# Patient Record
Sex: Female | Born: 1985 | Race: Black or African American | Hispanic: No | Marital: Single | Smoking: Former smoker
Health system: Southern US, Community
[De-identification: ages and names within clinical notes are randomized; demographics above are authoritative.]

## PROBLEM LIST (undated history)

## (undated) ENCOUNTER — Inpatient Hospital Stay (HOSPITAL_COMMUNITY): Payer: Self-pay

## (undated) HISTORY — PX: WISDOM TOOTH EXTRACTION: SHX21

---
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
## 2012-11-01 ENCOUNTER — Encounter (HOSPITAL_COMMUNITY): Payer: Self-pay | Admitting: Emergency Medicine

## 2012-11-01 ENCOUNTER — Emergency Department (HOSPITAL_COMMUNITY)
Admission: EM | Admit: 2012-11-01 | Discharge: 2012-11-01 | Disposition: A | Discharge status: Home / Self Care | Payer: BC Managed Care – PPO | Source: Home / Self Care

## 2012-11-01 DIAGNOSIS — N644 Mastodynia: Secondary | ICD-10-CM

## 2012-11-01 MED ORDER — DICLOFENAC POTASSIUM 50 MG PO TABS: 50.0000 mg | ORAL_TABLET | Freq: Three times a day (TID) | ORAL | Status: DC

## 2012-11-01 NOTE — ED Provider Notes (Signed)
Medical screening examination/treatment/procedure(s) were performed by resident physician or non-physician practitioner and as supervising physician I was immediately available for consultation/collaboration.   Chanele Douglas DOUGLAS MD.   Addaline Peplinski D Karan Ramnauth, MD 11/01/12 1741 

## 2012-11-01 NOTE — ED Notes (Signed)
Patient requested a note for missing work and missing class today, provided 2 notes

## 2012-11-01 NOTE — ED Provider Notes (Signed)
   History    CSN: [id number] Arrival date & time 11/01/12  1332  First MD Initiated Contact with Patient 11/01/12 1427     Chief Complaint  Patient presents with  . Breast Pain   (Consider location/radiation/quality/duration/timing/severity/associated sxs/prior Treatment) HPI Comments: 27 year old female presents with pain in the right lateral breast for over one month. Approximately one month before that she was struck in the right lateral breast with a softball. She describes the pain as sharp, intermittent and shooting. She states that it is constant. Worse with touch and lying on the right side. Denies skin discoloration, visible lumps or skin changes. Denies correlation with menses or tearing cycle.  History reviewed. No pertinent past medical history. History reviewed. No pertinent past surgical history. No family history on file. History  Substance Use Topics  . Smoking status: Never Smoker   . Smokeless tobacco: Not on file  . Alcohol Use: No   OB History   Grav Para Term Preterm Abortions TAB SAB Ect Mult Living                 Review of Systems  Constitutional: Negative.   All other systems reviewed and are negative.    Allergies  Review of patient's allergies indicates no known allergies.  Home Medications   Current Outpatient Rx  Name  Route  Sig  Dispense  Refill  . diclofenac (CATAFLAM) 50 MG tablet   Oral   Take 1 tablet (50 mg total) by mouth 3 (three) times daily. Prn pain. Take with food.   21 tablet   0    BP 132/84  Pulse 84  Temp(Src) 98.4 F (36.9 C) (Oral)  Resp 16  SpO2 100%  LMP 10/25/2012 Physical Exam  Nursing note and vitals reviewed. Constitutional: She appears well-developed and well-nourished. No distress.  Neck: Normal range of motion. Neck supple.  Pulmonary/Chest: Effort normal. Right breast exhibits tenderness. Right breast exhibits no inverted nipple, no mass, no nipple discharge and no skin change. Left breast exhibits no  inverted nipple, no mass, no nipple discharge, no skin change and no tenderness. Breasts are symmetrical.    Markings of breast as above shows area of tenderness, No tenderness of nipple. No swelling. No discoloration or asymmetry. Both breasts have identical breast mass texture with dense tissue. No nodules or lumps seen or palpated.   Musculoskeletal: She exhibits no edema and no tenderness.  Neurological: She is alert. She exhibits normal muscle tone.  Skin: Skin is warm and dry.  Psychiatric: She has a normal mood and affect.    ED Course  Procedures (including critical care time) Labs Reviewed - No data to display No results found. 1. Breast pain in female     MDM  [facility] locally See your doctor in 1 week when you get back home. May need imaging. No abnormal findings of breast today. Kim,RN present during exam.  Hayden Rasmussen, NP 11/01/12 1458

## 2012-11-01 NOTE — ED Notes (Signed)
Reports right breast pain that started a month ago, no bump, no bruise, and no known injury at the time.  Patient does reports a month prior to breast soreness did get hit with softball in right breast, but unsure of related

## 2013-02-05 ENCOUNTER — Encounter (HOSPITAL_COMMUNITY): Payer: Self-pay | Admitting: Emergency Medicine

## 2013-02-05 ENCOUNTER — Emergency Department (INDEPENDENT_AMBULATORY_CARE_PROVIDER_SITE_OTHER)
Admission: EM | Admit: 2013-02-05 | Discharge: 2013-02-05 | Disposition: A | Discharge status: Home / Self Care | Payer: BC Managed Care – PPO | Source: Home / Self Care | Attending: Family Medicine | Admitting: Family Medicine

## 2013-02-05 DIAGNOSIS — J069 Acute upper respiratory infection, unspecified: Secondary | ICD-10-CM

## 2013-02-05 NOTE — ED Provider Notes (Addendum)
CSN: [id number]     Arrival date & time 02/05/13  1352 History   None    Chief Complaint  Patient presents with  . URI   (Consider location/radiation/quality/duration/timing/severity/associated sxs/prior Treatment) Patient is a 27 y.o. female presenting with URI. The history is provided by the patient.  URI Presenting symptoms: congestion, cough, fatigue, rhinorrhea and sore throat   Presenting symptoms: no fever   Severity:  Mild Onset quality:  Gradual Duration:  3 days Progression:  Unchanged Chronicity:  New   History reviewed. No pertinent past medical history. History reviewed. No pertinent past surgical history. No family history on file. History  Substance Use Topics  . Smoking status: Never Smoker   . Smokeless tobacco: Not on file  . Alcohol Use: No   OB History   Grav Para Term Preterm Abortions TAB SAB Ect Mult Living                 Review of Systems  Constitutional: Positive for fatigue. Negative for fever.  HENT: Positive for congestion, postnasal drip, rhinorrhea and sore throat.   Respiratory: Positive for cough.   Gastrointestinal: Negative.     Allergies  Review of patient's allergies indicates no known allergies.  Home Medications   Current Outpatient Rx  Name  Route  Sig  Dispense  Refill  . diclofenac (CATAFLAM) 50 MG tablet   Oral   Take 1 tablet (50 mg total) by mouth 3 (three) times daily. Prn pain. Take with food.   21 tablet   0    BP 130/92  Pulse 64  Temp(Src) 98.4 F (36.9 C) (Oral)  Resp 17  SpO2 98%  LMP 01/24/2013 Physical Exam  Nursing note and vitals reviewed. Constitutional: She is oriented to person, place, and time. She appears well-developed and well-nourished.  HENT:  Head: Normocephalic.  Right Ear: External ear normal.  Left Ear: External ear normal.  Nose: Nose normal.  Mouth/Throat: Oropharynx is clear and moist.  Eyes: Pupils are equal, round, and reactive to light.  Neck: Normal range of motion. Neck  supple.  Cardiovascular: Normal rate, normal heart sounds and intact distal pulses.   Pulmonary/Chest: Effort normal and breath sounds normal.  Abdominal: Soft. Bowel sounds are normal.  Lymphadenopathy:    She has no cervical adenopathy.  Neurological: She is alert and oriented to person, place, and time.  Skin: Skin is warm and dry.    ED Course  Procedures (including critical care time) Labs Review Labs Reviewed - No data to display Imaging Review No results found.    MDM      Linna Hoff, MD 02/05/13 1550  Linna Hoff, MD 02/08/13 1230

## 2013-02-05 NOTE — ED Notes (Signed)
Pt  Reports  Symptoms  Of  Congested      Cough   Symptoms     X  2    Days  Reports  General  Fatigue  As  Well    Pt   Ambulated to  Room with a  Slow  Steady   Gait  She  Is  Sitting upright on  Exam table  Speaking in  Complete  sentances  And is  In no acute  Distress

## 2013-04-11 NOTE — L&D Delivery Note (Signed)
I was present for the exam and agree with above.  [place], [place] 12/17/2013 8:37 PM

## 2013-04-11 NOTE — L&D Delivery Note (Signed)
Delivery Note At 4:28 PM a viable female was delivered via Vaginal, Spontaneous Delivery (Presentation: ; Occiput Anterior).  APGAR: 4, 8; weight pending .   Placenta status: Intact, Spontaneous.  Cord: 3 vessels with the following complications: none .  Cord pH: pending Of note, the neonate had approximately 2 minutes of HR in the 70-90s while pushing (the patient only pushed for approximately 3 minutes total). The baby delivered quickly, had poor color, was slow to cry despite rigorous stimulation, and initially had a HR in the 60s. She was bulb suctioned, the cord was clamped and cut, and code APGARs was called. Her HR, breathing and color improved. She was able to stay in the room with skin/skin with her mother.  Anesthesia: Epidural  Episiotomy: None Lacerations: 2nd degree;Perineal Suture Repair: 3.0 vicryl rapide Est. Blood Loss (mL):   Mom to postpartum.  Baby to Couplet care / Skin to Skin.  Joanna Puff 12/17/2013, 5:26 PM

## 2013-05-01 ENCOUNTER — Encounter: Payer: Self-pay | Admitting: *Deleted

## 2013-05-16 ENCOUNTER — Other Ambulatory Visit: Payer: Self-pay | Admitting: Advanced Practice Midwife

## 2013-05-16 ENCOUNTER — Ambulatory Visit (INDEPENDENT_AMBULATORY_CARE_PROVIDER_SITE_OTHER): Payer: BC Managed Care – PPO | Admitting: Advanced Practice Midwife

## 2013-05-16 ENCOUNTER — Encounter: Payer: Self-pay | Admitting: Advanced Practice Midwife

## 2013-05-16 VITALS — BP 124/77 | Temp 97.6°F | Ht 65.0 in | Wt 179.2 lb

## 2013-05-16 DIAGNOSIS — Z3491 Encounter for supervision of normal pregnancy, unspecified, first trimester: Secondary | ICD-10-CM

## 2013-05-16 DIAGNOSIS — Z3682 Encounter for antenatal screening for nuchal translucency: Secondary | ICD-10-CM

## 2013-05-16 DIAGNOSIS — Z3480 Encounter for supervision of other normal pregnancy, unspecified trimester: Secondary | ICD-10-CM

## 2013-05-16 DIAGNOSIS — N926 Irregular menstruation, unspecified: Secondary | ICD-10-CM

## 2013-05-16 DIAGNOSIS — Z789 Other specified health status: Secondary | ICD-10-CM

## 2013-05-16 LAB — POCT URINALYSIS DIP (DEVICE)
Bilirubin Urine: NEGATIVE
Glucose, UA: NEGATIVE mg/dL
HGB URINE DIPSTICK: NEGATIVE
Leukocytes, UA: NEGATIVE
Nitrite: NEGATIVE
PROTEIN: NEGATIVE mg/dL
Urobilinogen, UA: 1 mg/dL (ref 0.0–1.0)
pH: 6 (ref 5.0–8.0)

## 2013-05-16 NOTE — Patient Instructions (Signed)
Pregnancy - First Trimester  During sexual intercourse, millions of sperm go into the vagina. Only 1 sperm will penetrate and fertilize the female egg while it is in the Fallopian tube. One week later, the fertilized egg implants into the wall of the uterus. An embryo begins to develop into a baby. At 6 to 8 weeks, the eyes and face are formed and the heartbeat can be seen on ultrasound. At the end of 12 weeks (first trimester), all the baby's organs are formed. Now that you are pregnant, you will want to do everything you can to have a healthy baby. Two of the most important things are to get good prenatal care and follow your caregiver's instructions. Prenatal care is all the medical care you receive before the baby's birth. It is given to prevent, find, and treat problems during the pregnancy and childbirth.  PRENATAL EXAMS  · During prenatal visits, your weight, blood pressure, and urine are checked. This is done to make sure you are healthy and progressing normally during the pregnancy.  · A pregnant woman should gain 25 to 35 pounds during the pregnancy. However, if you are overweight or underweight, your caregiver will advise you regarding your weight.  · Your caregiver will ask and answer questions for you.  · Blood work, cervical cultures, other necessary tests, and a Pap test are done during your prenatal exams. These tests are done to check on your health and the probable health of your baby. Tests are strongly recommended and done for HIV with your permission. This is the virus that causes AIDS. These tests are done because medicines can be given to help prevent your baby from being born with this infection should you have been infected without knowing it. Blood work is also used to find out your blood type, previous infections, and follow your blood levels (hemoglobin).  · Low hemoglobin (anemia) is common during pregnancy. Iron and vitamins are given to help prevent this. Later in the pregnancy, blood  tests for diabetes will be done along with any other tests if any problems develop.  · You may need other tests to make sure you and the baby are doing well.  CHANGES DURING THE FIRST TRIMESTER   Your body goes through many changes during pregnancy. They vary from person to person. Talk to your caregiver about changes you notice and are concerned about. Changes can include:  · Your menstrual period stops.  · The egg and sperm carry the genes that determine what you look like. Genes from you and your partner are forming a baby. The female genes determine whether the baby is a boy or a girl.  · Your body increases in girth and you may feel bloated.  · Feeling sick to your stomach (nauseous) and throwing up (vomiting). If the vomiting is uncontrollable, call your caregiver.  · Your breasts will begin to enlarge and become tender.  · Your nipples may stick out more and become darker.  · The need to urinate more. Painful urination may mean you have a bladder infection.  · Tiring easily.  · Loss of appetite.  · Cravings for certain kinds of food.  · At first, you may gain or lose a couple of pounds.  · You may have changes in your emotions from day to day (excited to be pregnant or concerned something may go wrong with the pregnancy and baby).  · You may have more vivid and strange dreams.  HOME CARE INSTRUCTIONS   ·   It is very important to avoid all smoking, alcohol and non-prescribed drugs during your pregnancy. These affect the formation and growth of the baby. Avoid chemicals while pregnant to ensure the delivery of a healthy infant.  · Start your prenatal visits by the 12th week of pregnancy. They are usually scheduled monthly at first, then more often in the last 2 months before delivery. Keep your caregiver's appointments. Follow your caregiver's instructions regarding medicine use, blood and lab tests, exercise, and diet.  · During pregnancy, you are providing food for you and your baby. Eat regular, well-balanced  meals. Choose foods such as meat, fish, milk and other low fat dairy products, vegetables, fruits, and whole-grain breads and cereals. Your caregiver will tell you of the ideal weight gain.  · You can help morning sickness by keeping soda crackers at the bedside. Eat a couple before arising in the morning. You may want to use the crackers without salt on them.  · Eating 4 to 5 small meals rather than 3 large meals a day also may help the nausea and vomiting.  · Drinking liquids between meals instead of during meals also seems to help nausea and vomiting.  · A physical sexual relationship may be continued throughout pregnancy if there are no other problems. Problems may be early (premature) leaking of amniotic fluid from the membranes, vaginal bleeding, or belly (abdominal) pain.  · Exercise regularly if there are no restrictions. Check with your caregiver or physical therapist if you are unsure of the safety of some of your exercises. Greater weight gain will occur in the last 2 trimesters of pregnancy. Exercising will help:  · Control your weight.  · Keep you in shape.  · Prepare you for labor and delivery.  · Help you lose your pregnancy weight after you deliver your baby.  · Wear a good support or jogging bra for breast tenderness during pregnancy. This may help if worn during sleep too.  · Ask when prenatal classes are available. Begin classes when they are offered.  · Do not use hot tubs, steam rooms, or saunas.  · Wear your seat belt when driving. This protects you and your baby if you are in an accident.  · Avoid raw meat, uncooked cheese, cat litter boxes, and soil used by cats throughout the pregnancy. These carry germs that can cause birth defects in the baby.  · The first trimester is a good time to visit your dentist for your dental health. Getting your teeth cleaned is okay. Use a softer toothbrush and brush gently during pregnancy.  · Ask for help if you have financial, counseling, or nutritional needs  during pregnancy. Your caregiver will be able to offer counseling for these needs as well as refer you for other special needs.  · Do not take any medicines or herbs unless told by your caregiver.  · Inform your caregiver if there is any mental or physical domestic violence.  · Make a list of emergency phone numbers of family, friends, hospital, and police and fire departments.  · Write down your questions. Take them to your prenatal visit.  · Do not douche.  · Do not cross your legs.  · If you have to stand for long periods of time, rotate you feet or take small steps in a circle.  · You may have more vaginal secretions that may require a sanitary pad. Do not use tampons or scented sanitary pads.  MEDICINES AND DRUG USE IN PREGNANCY  ·   Take prenatal vitamins as directed. The vitamin should contain 1 milligram of folic acid. Keep all vitamins out of reach of children. Only a couple vitamins or tablets containing iron may be fatal to a baby or young child when ingested.  · Avoid use of all medicines, including herbs, over-the-counter medicines, not prescribed or suggested by your caregiver. Only take over-the-counter or prescription medicines for pain, discomfort, or fever as directed by your caregiver. Do not use aspirin, ibuprofen, or naproxen unless directed by your caregiver.  · Let your caregiver also know about herbs you may be using.  · Alcohol is related to a number of birth defects. This includes fetal alcohol syndrome. All alcohol, in any form, should be avoided completely. Smoking will cause low birth rate and premature babies.  · Street or illegal drugs are very harmful to the baby. They are absolutely forbidden. A baby born to an addicted mother will be addicted at birth. The baby will go through the same withdrawal an adult does.  · Let your caregiver know about any medicines that you have to take and for what reason you take them.  SEEK MEDICAL CARE IF:   You have any concerns or worries during your  pregnancy. It is better to call with your questions if you feel they cannot wait, rather than worry about them.  SEEK IMMEDIATE MEDICAL CARE IF:   · An unexplained oral temperature above 102° F (38.9° C) develops, or as your caregiver suggests.  · You have leaking of fluid from the vagina (birth canal). If leaking membranes are suspected, take your temperature and inform your caregiver of this when you call.  · There is vaginal spotting or bleeding. Notify your caregiver of the amount and how many pads are used.  · You develop a bad smelling vaginal discharge with a change in the color.  · You continue to feel sick to your stomach (nauseated) and have no relief from remedies suggested. You vomit blood or coffee ground-like materials.  · You lose more than 2 pounds of weight in 1 week.  · You gain more than 2 pounds of weight in 1 week and you notice swelling of your face, hands, feet, or legs.  · You gain 5 pounds or more in 1 week (even if you do not have swelling of your hands, face, legs, or feet).  · You get exposed to German measles and have never had them.  · You are exposed to fifth disease or chickenpox.  · You develop belly (abdominal) pain. Round ligament discomfort is a common non-cancerous (benign) cause of abdominal pain in pregnancy. Your caregiver still must evaluate this.  · You develop headache, fever, diarrhea, pain with urination, or shortness of breath.  · You fall or are in a car accident or have any kind of trauma.  · There is mental or physical violence in your home.  Document Released: 03/22/2001 Document Revised: 12/21/2011 Document Reviewed: 09/23/2008  [employer]® Patient Information ©2014 [employer].

## 2013-05-16 NOTE — Progress Notes (Signed)
p-87 

## 2013-05-16 NOTE — Progress Notes (Signed)
New OB. See smart set  Subjective:    [name] is a G1P0 617w3d being seen today for her first obstetrical visit.  Her obstetrical history is significant for Primigravida. Patient does intend to breast feed. Pregnancy history fully reviewed.  Patient reports no complaints.  Filed Vitals:   05/16/13 1444 05/16/13 1446  BP: 124/77   Temp: 97.6 F (36.4 C)   Height:  5\' 5"  (1.651 m)  Weight: 81.285 kg (179 lb 3.2 oz)     HISTORY: OB History  Gravida Para Term Preterm AB SAB TAB Ectopic Multiple Living  1             # Outcome Date GA Lbr Len/2nd Weight Sex Delivery Anes PTL Lv  1 CUR              History reviewed. No pertinent past medical history. Past Surgical History  Procedure Laterality Date  . Knee surgery Left 2001   Family History  Problem Relation Age of Onset  . Hypertension Mother   . Diabetes Maternal Grandfather      Exam    Uterus:  Fundal Height: 8 cm  Pelvic Exam:    Perineum: No Hemorrhoids, Normal Perineum   Vulva: Bartholin's, Urethra, Skene's normal   Vagina:  normal discharge   pH:    Cervix: no cervical motion tenderness and nulliparous appearance   Adnexa: normal adnexa and no mass, fullness, tenderness   Bony Pelvis: gynecoid  System: Breast:  normal appearance, no masses or tenderness   Skin: normal coloration and turgor, no rashes    Neurologic: oriented, grossly non-focal   Extremities: normal strength, tone, and muscle mass   HEENT neck supple with midline trachea   Mouth/Teeth mucous membranes moist, pharynx normal without lesions   Neck supple and no masses   Cardiovascular: regular rate and rhythm, no murmurs or gallops   Respiratory:  appears well, vitals normal, no respiratory distress, acyanotic, normal RR, ear and throat exam is normal, neck free of mass or lymphadenopathy, chest clear, no wheezing, crepitations, rhonchi, normal symmetric air entry   Abdomen: soft, non-tender; bowel sounds normal; no masses,  no  organomegaly   Urinary: urethral meatus normal      Assessment:    Pregnancy: G1P0 Patient Active Problem List   Diagnosis Date Noted  . Not immune to rubella 05/17/2013  . Supervision of normal pregnancy in first trimester 05/16/2013        Plan:     Initial labs drawn. Prenatal vitamins. Problem list reviewed and updated. Genetic Screening discussed Integrated Screen: ordered.  Ultrasound discussed; fetal survey: ordered.  Follow up in 4 weeks. 50% of 30 min visit spent on counseling and coordination of care.     [facility]MARIE 05/17/2013

## 2013-05-17 DIAGNOSIS — Z789 Other specified health status: Secondary | ICD-10-CM | POA: Insufficient documentation

## 2013-05-17 LAB — OBSTETRIC PANEL
ANTIBODY SCREEN: NEGATIVE
Basophils Absolute: 0 10*3/uL (ref 0.0–0.1)
Basophils Relative: 0 % (ref 0–1)
EOS ABS: 0 10*3/uL (ref 0.0–0.7)
EOS PCT: 0 % (ref 0–5)
HEMATOCRIT: 37 % (ref 36.0–46.0)
Hemoglobin: 12.5 g/dL (ref 12.0–15.0)
Hepatitis B Surface Ag: NEGATIVE
LYMPHS ABS: 2.5 10*3/uL (ref 0.7–4.0)
LYMPHS PCT: 20 % (ref 12–46)
MCH: 28.9 pg (ref 26.0–34.0)
MCHC: 33.8 g/dL (ref 30.0–36.0)
MCV: 85.6 fL (ref 78.0–100.0)
MONO ABS: 1.2 10*3/uL — AB (ref 0.1–1.0)
Monocytes Relative: 10 % (ref 3–12)
Neutro Abs: 9.1 10*3/uL — ABNORMAL HIGH (ref 1.7–7.7)
Neutrophils Relative %: 70 % (ref 43–77)
Platelets: 321 10*3/uL (ref 150–400)
RBC: 4.32 MIL/uL (ref 3.87–5.11)
RDW: 14.1 % (ref 11.5–15.5)
RUBELLA: 0.47 {index} (ref <0.90)
Rh Type: POSITIVE
WBC: 13 10*3/uL — AB (ref 4.0–10.5)

## 2013-05-17 LAB — HIV ANTIBODY (ROUTINE TESTING W REFLEX): HIV: NONREACTIVE

## 2013-05-17 LAB — CULTURE, OB URINE
COLONY COUNT: NO GROWTH
ORGANISM ID, BACTERIA: NO GROWTH

## 2013-05-20 LAB — PRESCRIPTION MONITORING PROFILE (19 PANEL)
AMPHETAMINE/METH: NEGATIVE ng/mL
BARBITURATE SCREEN, URINE: NEGATIVE ng/mL
BENZODIAZEPINE SCREEN, URINE: NEGATIVE ng/mL
Buprenorphine, Urine: NEGATIVE ng/mL
Carisoprodol, Urine: NEGATIVE ng/mL
Cocaine Metabolites: NEGATIVE ng/mL
Creatinine, Urine: 255.36 mg/dL (ref >20.0)
Fentanyl, Ur: NEGATIVE ng/mL
MDMA URINE: NEGATIVE ng/mL
MEPERIDINE UR: NEGATIVE ng/mL
Methadone Screen, Urine: NEGATIVE ng/mL
Methaqualone: NEGATIVE ng/mL
NITRITES URINE, INITIAL: NEGATIVE ug/mL
OPIATE SCREEN, URINE: NEGATIVE ng/mL
Oxycodone Screen, Ur: NEGATIVE ng/mL
PH URINE, INITIAL: 6.3 pH (ref 4.5–8.9)
PROPOXYPHENE: NEGATIVE ng/mL
Phencyclidine, Ur: NEGATIVE ng/mL
TAPENTADOLUR: NEGATIVE ng/mL
TRAMADOL UR: NEGATIVE ng/mL
ZOLPIDEM, URINE: NEGATIVE ng/mL

## 2013-05-20 LAB — HEMOGLOBINOPATHY EVALUATION
HGB A: 97.1 % (ref 96.8–97.8)
Hemoglobin Other: 0 %
Hgb A2 Quant: 2.9 % (ref 2.2–3.2)
Hgb F Quant: 0 % (ref 0.0–2.0)
Hgb S Quant: 0 %

## 2013-05-20 LAB — CANNABANOIDS (GC/LC/MS), URINE: THC-COOH (GC/LC/MS), ur confirm: 128 ng/mL — AB

## 2013-05-28 ENCOUNTER — Telehealth: Payer: Self-pay | Admitting: *Deleted

## 2013-05-28 DIAGNOSIS — A749 Chlamydial infection, unspecified: Secondary | ICD-10-CM

## 2013-05-28 DIAGNOSIS — O98819 Other maternal infectious and parasitic diseases complicating pregnancy, unspecified trimester: Principal | ICD-10-CM

## 2013-05-28 MED ORDER — AZITHROMYCIN 250 MG PO TABS: ORAL_TABLET | ORAL | Status: DC

## 2013-05-28 NOTE — Telephone Encounter (Signed)
Called [name] and notified her + chlamydia and need for her and her partner to be treated and rx for her sent to her pharmacy of choice. [name] voices understanding. Health department form completed.

## 2013-05-28 NOTE — Telephone Encounter (Signed)
Per chart review patient had + chlamydia on pap- need treatment sent to pharmacy and patient notified.

## 2013-06-04 ENCOUNTER — Other Ambulatory Visit: Payer: Self-pay | Admitting: Obstetrics and Gynecology

## 2013-06-04 DIAGNOSIS — Z3682 Encounter for antenatal screening for nuchal translucency: Secondary | ICD-10-CM

## 2013-06-07 ENCOUNTER — Ambulatory Visit (HOSPITAL_COMMUNITY)
Admission: RE | Admit: 2013-06-07 | Discharge: 2013-06-07 | Disposition: A | Discharge status: Home / Self Care | Payer: BC Managed Care – PPO | Source: Ambulatory Visit | Attending: Advanced Practice Midwife | Admitting: Advanced Practice Midwife

## 2013-06-07 ENCOUNTER — Ambulatory Visit (HOSPITAL_COMMUNITY)
Admission: RE | Admit: 2013-06-07 | Discharge: 2013-06-07 | Disposition: A | Discharge status: Home / Self Care | Payer: BC Managed Care – PPO | Source: Ambulatory Visit | Attending: Obstetrics and Gynecology | Admitting: Obstetrics and Gynecology

## 2013-06-07 VITALS — BP 135/81 | HR 87 | Wt 184.0 lb

## 2013-06-07 DIAGNOSIS — Z789 Other specified health status: Secondary | ICD-10-CM

## 2013-06-07 DIAGNOSIS — Z3689 Encounter for other specified antenatal screening: Secondary | ICD-10-CM | POA: Insufficient documentation

## 2013-06-07 DIAGNOSIS — O3510X0 Maternal care for (suspected) chromosomal abnormality in fetus, unspecified, not applicable or unspecified: Secondary | ICD-10-CM | POA: Insufficient documentation

## 2013-06-07 DIAGNOSIS — Z3682 Encounter for antenatal screening for nuchal translucency: Secondary | ICD-10-CM

## 2013-06-07 DIAGNOSIS — O351XX0 Maternal care for (suspected) chromosomal abnormality in fetus, not applicable or unspecified: Secondary | ICD-10-CM | POA: Insufficient documentation

## 2013-06-07 DIAGNOSIS — Z3491 Encounter for supervision of normal pregnancy, unspecified, first trimester: Secondary | ICD-10-CM

## 2013-06-12 ENCOUNTER — Ambulatory Visit (INDEPENDENT_AMBULATORY_CARE_PROVIDER_SITE_OTHER): Payer: Medicaid Other | Admitting: Obstetrics and Gynecology

## 2013-06-12 ENCOUNTER — Encounter: Payer: Self-pay | Admitting: Obstetrics and Gynecology

## 2013-06-12 VITALS — BP 116/75 | Temp 98.2°F | Wt 181.1 lb

## 2013-06-12 DIAGNOSIS — B3731 Acute candidiasis of vulva and vagina: Secondary | ICD-10-CM

## 2013-06-12 DIAGNOSIS — Z3480 Encounter for supervision of other normal pregnancy, unspecified trimester: Secondary | ICD-10-CM

## 2013-06-12 DIAGNOSIS — F1210 Cannabis abuse, uncomplicated: Secondary | ICD-10-CM

## 2013-06-12 DIAGNOSIS — A749 Chlamydial infection, unspecified: Secondary | ICD-10-CM | POA: Insufficient documentation

## 2013-06-12 DIAGNOSIS — Z3491 Encounter for supervision of normal pregnancy, unspecified, first trimester: Secondary | ICD-10-CM

## 2013-06-12 DIAGNOSIS — B373 Candidiasis of vulva and vagina: Secondary | ICD-10-CM

## 2013-06-12 DIAGNOSIS — F1290 Cannabis use, unspecified, uncomplicated: Secondary | ICD-10-CM

## 2013-06-12 LAB — POCT URINALYSIS DIP (DEVICE)
BILIRUBIN URINE: NEGATIVE
GLUCOSE, UA: NEGATIVE mg/dL
Hgb urine dipstick: NEGATIVE
Ketones, ur: NEGATIVE mg/dL
NITRITE: NEGATIVE
PH: 6 (ref 5.0–8.0)
Protein, ur: 30 mg/dL — AB
Specific Gravity, Urine: >=1.03 (ref 1.005–1.030)
Urobilinogen, UA: 1 mg/dL (ref 0.0–1.0)

## 2013-06-12 MED ORDER — FLUCONAZOLE 150 MG PO TABS: 150.0000 mg | ORAL_TABLET | Freq: Once | ORAL | Status: DC

## 2013-06-12 MED ORDER — FLUCONAZOLE 150 MG PO TABS: ORAL_TABLET | ORAL | Status: DC

## 2013-06-12 MED ORDER — PRENATAL VITAMINS 0.8 MG PO TABS: 1.0000 | ORAL_TABLET | Freq: Every day | ORAL | Status: DC

## 2013-06-12 NOTE — Patient Instructions (Signed)
Second Trimester of Pregnancy The second trimester is from week 13 through week 28, months 4 through 6. The second trimester is often a time when you feel your best. Your body has also adjusted to being pregnant, and you begin to feel better physically. Usually, morning sickness has lessened or quit completely, you may have more energy, and you may have an increase in appetite. The second trimester is also a time when the fetus is growing rapidly. At the end of the sixth month, the fetus is about 9 inches long and weighs about 1 pounds. You will likely begin to feel the baby move (quickening) between 18 and 20 weeks of the pregnancy. BODY CHANGES Your body goes through many changes during pregnancy. The changes vary from woman to woman.   Your weight will continue to increase. You will notice your lower abdomen bulging out.  You may begin to get stretch marks on your hips, abdomen, and breasts.  You may develop headaches that can be relieved by medicines approved by your caregiver.  You may urinate more often because the fetus is pressing on your bladder.  You may develop or continue to have heartburn as a result of your pregnancy.  You may develop constipation because certain hormones are causing the muscles that push waste through your intestines to slow down.  You may develop hemorrhoids or swollen, bulging veins (varicose veins).  You may have back pain because of the weight gain and pregnancy hormones relaxing your joints between the bones in your pelvis and as a result of a shift in weight and the muscles that support your balance.  Your breasts will continue to grow and be tender.  Your gums may bleed and may be sensitive to brushing and flossing.  Dark spots or blotches (chloasma, mask of pregnancy) may develop on your face. This will likely fade after the baby is born.  A dark line from your belly button to the pubic area (linea nigra) may appear. This will likely fade after the  baby is born. WHAT TO EXPECT AT YOUR PRENATAL VISITS During a routine prenatal visit:  You will be weighed to make sure you and the fetus are growing normally.  Your blood pressure will be taken.  Your abdomen will be measured to track your baby's growth.  The fetal heartbeat will be listened to.  Any test results from the previous visit will be discussed. Your caregiver may ask you:  How you are feeling.  If you are feeling the baby move.  If you have had any abnormal symptoms, such as leaking fluid, bleeding, severe headaches, or abdominal cramping.  If you have any questions. Other tests that may be performed during your second trimester include:  Blood tests that check for:  Low iron levels (anemia).  Gestational diabetes (between 24 and 28 weeks).  Rh antibodies.  Urine tests to check for infections, diabetes, or protein in the urine.  An ultrasound to confirm the proper growth and development of the baby.  An amniocentesis to check for possible genetic problems.  Fetal screens for spina bifida and Down syndrome. HOME CARE INSTRUCTIONS   Avoid all smoking, herbs, alcohol, and unprescribed drugs. These chemicals affect the formation and growth of the baby.  Follow your caregiver's instructions regarding medicine use. There are medicines that are either safe or unsafe to take during pregnancy.  Exercise only as directed by your caregiver. Experiencing uterine cramps is a good sign to stop exercising.  Continue to eat regular,   healthy meals.  Wear a good support bra for breast tenderness.  Do not use hot tubs, steam rooms, or saunas.  Wear your seat belt at all times when driving.  Avoid raw meat, uncooked cheese, cat litter boxes, and soil used by cats. These carry germs that can cause birth defects in the baby.  Take your prenatal vitamins.  Try taking a stool softener (if your caregiver approves) if you develop constipation. Eat more high-fiber foods,  such as fresh vegetables or fruit and whole grains. Drink plenty of fluids to keep your urine clear or pale yellow.  Take warm sitz baths to soothe any pain or discomfort caused by hemorrhoids. Use hemorrhoid cream if your caregiver approves.  If you develop varicose veins, wear support hose. Elevate your feet for 15 minutes, 3 4 times a day. Limit salt in your diet.  Avoid heavy lifting, wear low heel shoes, and practice good posture.  Rest with your legs elevated if you have leg cramps or low back pain.  Visit your dentist if you have not gone yet during your pregnancy. Use a soft toothbrush to brush your teeth and be gentle when you floss.  A sexual relationship may be continued unless your caregiver directs you otherwise.  Continue to go to all your prenatal visits as directed by your caregiver. SEEK MEDICAL CARE IF:   You have dizziness.  You have mild pelvic cramps, pelvic pressure, or nagging pain in the abdominal area.  You have persistent nausea, vomiting, or diarrhea.  You have a bad smelling vaginal discharge.  You have pain with urination. SEEK IMMEDIATE MEDICAL CARE IF:   You have a fever.  You are leaking fluid from your vagina.  You have spotting or bleeding from your vagina.  You have severe abdominal cramping or pain.  You have rapid weight gain or loss.  You have shortness of breath with chest pain.  You notice sudden or extreme swelling of your face, hands, ankles, feet, or legs.  You have not felt your baby move in over an hour.  You have severe headaches that do not go away with medicine.  You have vision changes. Document Released: 03/22/2001 Document Revised: 11/28/2012 Document Reviewed: 05/29/2012 [employer] Patient Information 2014 [employer].  

## 2013-06-12 NOTE — Progress Notes (Signed)
Was treated for CT, then got pruritic white discharge> Rx Diflucan Rx PNVs. US anatomy at 19-20 wks scheduled.

## 2013-06-12 NOTE — Progress Notes (Signed)
P=80 C/o of constant headache and pain in her neck; left side. Also requesting rx for cold/congestion. Gave information sheet of appropriate OTC meds in pregnancy.  C/o of white, chunky, itchy discharge; states the ABX for chlamydia gave her a yeast infection as she frequently gets yeast infections after ABX.

## 2013-06-13 ENCOUNTER — Other Ambulatory Visit: Payer: Self-pay | Admitting: *Deleted

## 2013-06-19 DIAGNOSIS — Z369 Encounter for antenatal screening, unspecified: Secondary | ICD-10-CM

## 2013-07-10 ENCOUNTER — Ambulatory Visit (INDEPENDENT_AMBULATORY_CARE_PROVIDER_SITE_OTHER): Payer: Medicaid Other | Admitting: Advanced Practice Midwife

## 2013-07-10 ENCOUNTER — Encounter: Payer: Self-pay | Admitting: Advanced Practice Midwife

## 2013-07-10 VITALS — BP 122/69 | Temp 97.0°F | Wt 189.6 lb

## 2013-07-10 DIAGNOSIS — Z3491 Encounter for supervision of normal pregnancy, unspecified, first trimester: Secondary | ICD-10-CM

## 2013-07-10 DIAGNOSIS — Z3480 Encounter for supervision of other normal pregnancy, unspecified trimester: Secondary | ICD-10-CM

## 2013-07-10 LAB — POCT URINALYSIS DIP (DEVICE)
BILIRUBIN URINE: NEGATIVE
Glucose, UA: NEGATIVE mg/dL
HGB URINE DIPSTICK: NEGATIVE
Ketones, ur: NEGATIVE mg/dL
NITRITE: NEGATIVE
PH: 7 (ref 5.0–8.0)
Protein, ur: NEGATIVE mg/dL
Specific Gravity, Urine: 1.025 (ref 1.005–1.030)
Urobilinogen, UA: 1 mg/dL (ref 0.0–1.0)

## 2013-07-10 MED ORDER — TERCONAZOLE 0.4 % VA CREA: 1.0000 | TOPICAL_CREAM | Freq: Every day | VAGINAL | Status: DC

## 2013-07-10 NOTE — Progress Notes (Signed)
Will rx Terazol 7.  Area on breasts appear to be dry skin.  US ordered.

## 2013-07-10 NOTE — Progress Notes (Signed)
Pulse- 88 Patient reports still having yeast issues, medication didn't really help last time.

## 2013-07-10 NOTE — Patient Instructions (Signed)
Second Trimester of Pregnancy The second trimester is from week 13 through week 28, months 4 through 6. The second trimester is often a time when you feel your best. Your body has also adjusted to being pregnant, and you begin to feel better physically. Usually, morning sickness has lessened or quit completely, you may have more energy, and you may have an increase in appetite. The second trimester is also a time when the fetus is growing rapidly. At the end of the sixth month, the fetus is about 9 inches long and weighs about 1 pounds. You will likely begin to feel the baby move (quickening) between 18 and 20 weeks of the pregnancy. BODY CHANGES Your body goes through many changes during pregnancy. The changes vary from woman to woman.   Your weight will continue to increase. You will notice your lower abdomen bulging out.  You may begin to get stretch marks on your hips, abdomen, and breasts.  You may develop headaches that can be relieved by medicines approved by your caregiver.  You may urinate more often because the fetus is pressing on your bladder.  You may develop or continue to have heartburn as a result of your pregnancy.  You may develop constipation because certain hormones are causing the muscles that push waste through your intestines to slow down.  You may develop hemorrhoids or swollen, bulging veins (varicose veins).  You may have back pain because of the weight gain and pregnancy hormones relaxing your joints between the bones in your pelvis and as a result of a shift in weight and the muscles that support your balance.  Your breasts will continue to grow and be tender.  Your gums may bleed and may be sensitive to brushing and flossing.  Dark spots or blotches (chloasma, mask of pregnancy) may develop on your face. This will likely fade after the baby is born.  A dark line from your belly button to the pubic area (linea nigra) may appear. This will likely fade after the  baby is born. WHAT TO EXPECT AT YOUR PRENATAL VISITS During a routine prenatal visit:  You will be weighed to make sure you and the fetus are growing normally.  Your blood pressure will be taken.  Your abdomen will be measured to track your baby's growth.  The fetal heartbeat will be listened to.  Any test results from the previous visit will be discussed. Your caregiver may ask you:  How you are feeling.  If you are feeling the baby move.  If you have had any abnormal symptoms, such as leaking fluid, bleeding, severe headaches, or abdominal cramping.  If you have any questions. Other tests that may be performed during your second trimester include:  Blood tests that check for:  Low iron levels (anemia).  Gestational diabetes (between 24 and 28 weeks).  Rh antibodies.  Urine tests to check for infections, diabetes, or protein in the urine.  An ultrasound to confirm the proper growth and development of the baby.  An amniocentesis to check for possible genetic problems.  Fetal screens for spina bifida and Down syndrome. HOME CARE INSTRUCTIONS   Avoid all smoking, herbs, alcohol, and unprescribed drugs. These chemicals affect the formation and growth of the baby.  Follow your caregiver's instructions regarding medicine use. There are medicines that are either safe or unsafe to take during pregnancy.  Exercise only as directed by your caregiver. Experiencing uterine cramps is a good sign to stop exercising.  Continue to eat regular,   healthy meals.  Wear a good support bra for breast tenderness.  Do not use hot tubs, steam rooms, or saunas.  Wear your seat belt at all times when driving.  Avoid raw meat, uncooked cheese, cat litter boxes, and soil used by cats. These carry germs that can cause birth defects in the baby.  Take your prenatal vitamins.  Try taking a stool softener (if your caregiver approves) if you develop constipation. Eat more high-fiber foods,  such as fresh vegetables or fruit and whole grains. Drink plenty of fluids to keep your urine clear or pale yellow.  Take warm sitz baths to soothe any pain or discomfort caused by hemorrhoids. Use hemorrhoid cream if your caregiver approves.  If you develop varicose veins, wear support hose. Elevate your feet for 15 minutes, 3 4 times a day. Limit salt in your diet.  Avoid heavy lifting, wear low heel shoes, and practice good posture.  Rest with your legs elevated if you have leg cramps or low back pain.  Visit your dentist if you have not gone yet during your pregnancy. Use a soft toothbrush to brush your teeth and be gentle when you floss.  A sexual relationship may be continued unless your caregiver directs you otherwise.  Continue to go to all your prenatal visits as directed by your caregiver. SEEK MEDICAL CARE IF:   You have dizziness.  You have mild pelvic cramps, pelvic pressure, or nagging pain in the abdominal area.  You have persistent nausea, vomiting, or diarrhea.  You have a bad smelling vaginal discharge.  You have pain with urination. SEEK IMMEDIATE MEDICAL CARE IF:   You have a fever.  You are leaking fluid from your vagina.  You have spotting or bleeding from your vagina.  You have severe abdominal cramping or pain.  You have rapid weight gain or loss.  You have shortness of breath with chest pain.  You notice sudden or extreme swelling of your face, hands, ankles, feet, or legs.  You have not felt your baby move in over an hour.  You have severe headaches that do not go away with medicine.  You have vision changes. Document Released: 03/22/2001 Document Revised: 11/28/2012 Document Reviewed: 05/29/2012 [employer] Patient Information 2014 [employer].  

## 2013-07-11 LAB — ALPHA FETOPROTEIN, MATERNAL
AFP: 29.3 IU/mL
CURR GEST AGE: 16.5 wks.days
MoM for AFP: 0.91
Open Spina bifida: NEGATIVE
Osb Risk: 1:36400 {titer}

## 2013-07-12 ENCOUNTER — Encounter: Payer: Self-pay | Admitting: Advanced Practice Midwife

## 2013-08-02 ENCOUNTER — Ambulatory Visit (HOSPITAL_COMMUNITY)
Admission: RE | Admit: 2013-08-02 | Discharge: 2013-08-02 | Disposition: A | Discharge status: Home / Self Care | Payer: Medicaid Other | Source: Ambulatory Visit | Attending: Obstetrics and Gynecology | Admitting: Obstetrics and Gynecology

## 2013-08-02 DIAGNOSIS — F1210 Cannabis abuse, uncomplicated: Secondary | ICD-10-CM | POA: Insufficient documentation

## 2013-08-02 DIAGNOSIS — O99340 Other mental disorders complicating pregnancy, unspecified trimester: Secondary | ICD-10-CM | POA: Insufficient documentation

## 2013-08-02 DIAGNOSIS — B373 Candidiasis of vulva and vagina: Secondary | ICD-10-CM

## 2013-08-02 DIAGNOSIS — Z3689 Encounter for other specified antenatal screening: Secondary | ICD-10-CM | POA: Insufficient documentation

## 2013-08-02 DIAGNOSIS — F1290 Cannabis use, unspecified, uncomplicated: Secondary | ICD-10-CM

## 2013-08-02 DIAGNOSIS — B3731 Acute candidiasis of vulva and vagina: Secondary | ICD-10-CM

## 2013-08-02 DIAGNOSIS — Z789 Other specified health status: Secondary | ICD-10-CM

## 2013-08-02 DIAGNOSIS — Z3491 Encounter for supervision of normal pregnancy, unspecified, first trimester: Secondary | ICD-10-CM

## 2013-08-02 IMAGING — US US OB COMP +14 WK
1 series · 12 of 28 positions shown · non-contrast
Comparison: none

[Series 1: us ob comp +14 wk · 72 acquisitions, 12 frames shown]
[im 3/72]
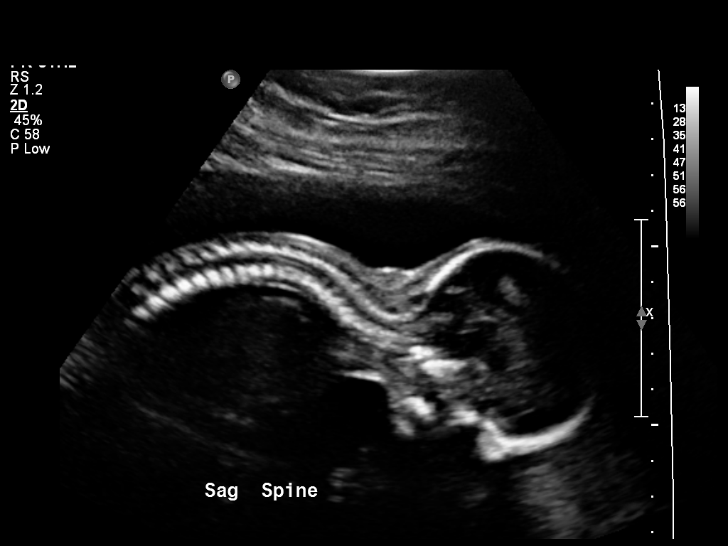
[im 8/72]
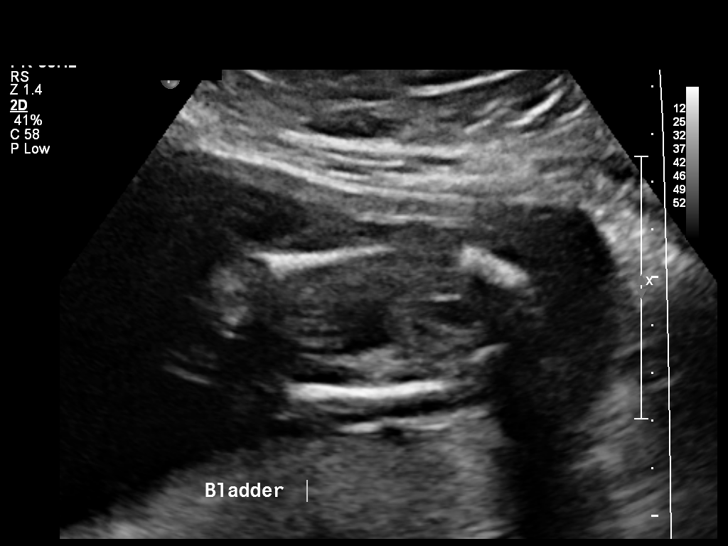
[im 14/72]
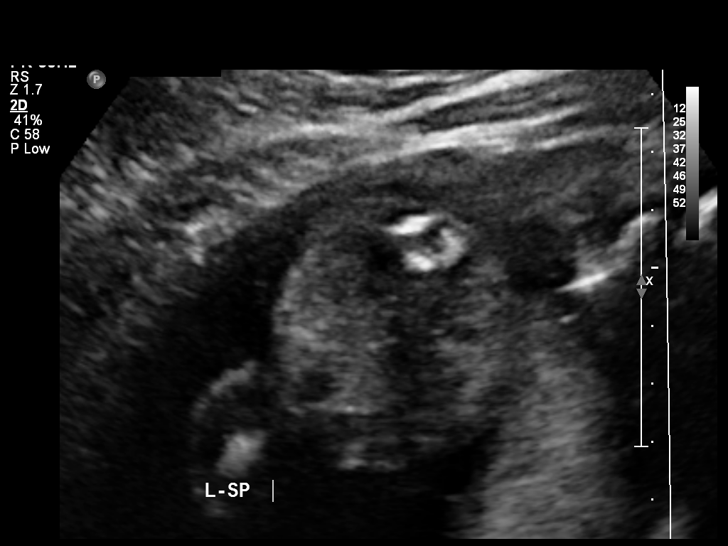
[im 22/72]
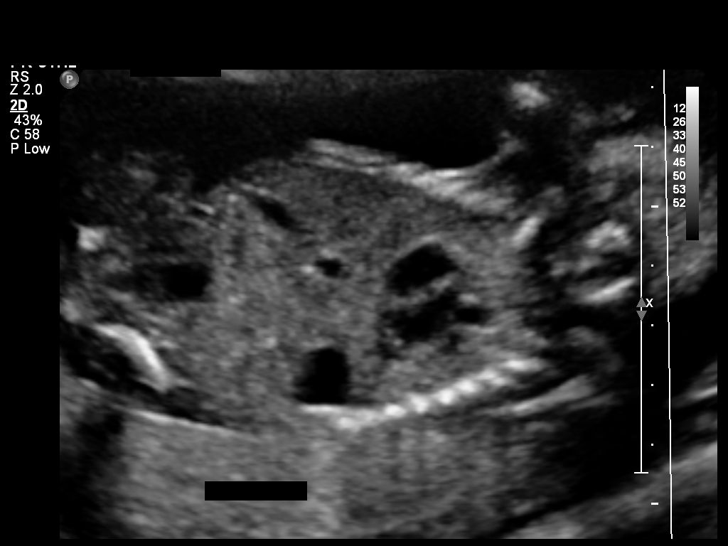
[im 27/72]
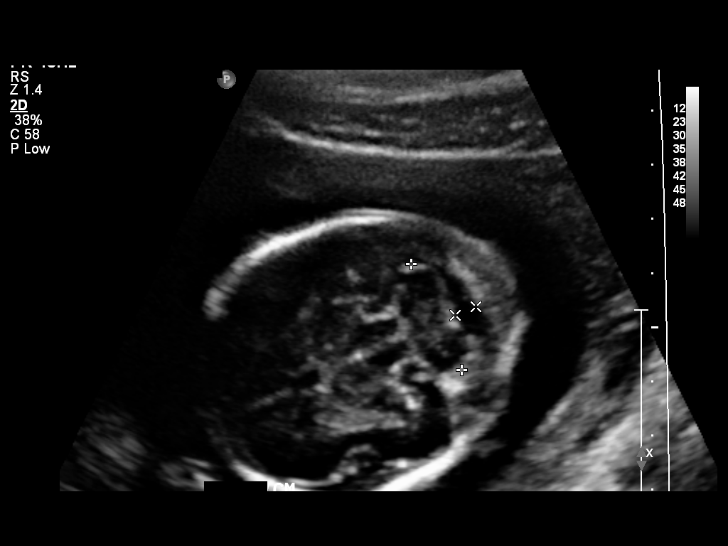
[im 32/72]
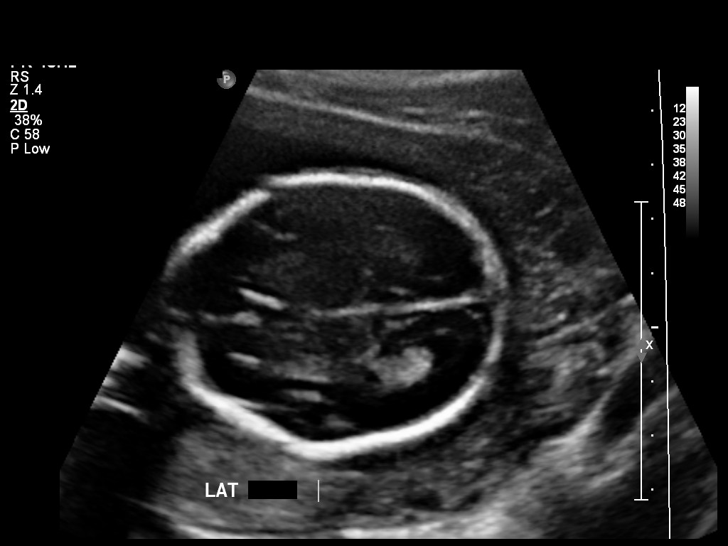
[im 40/72]
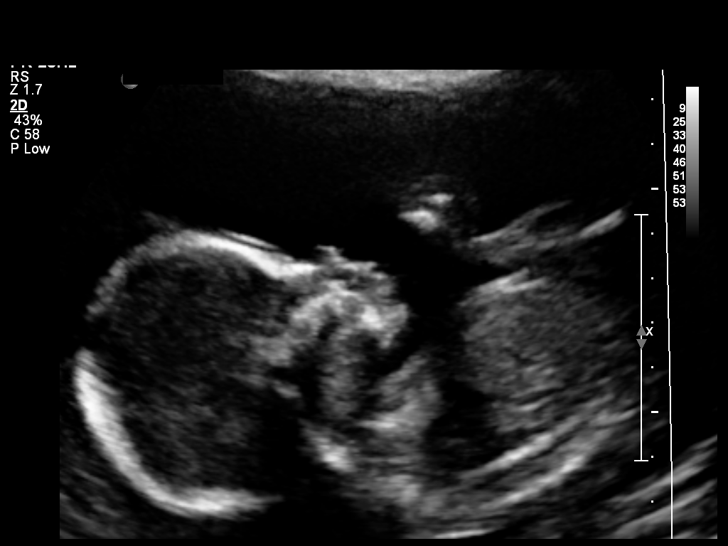
[im 45/72]
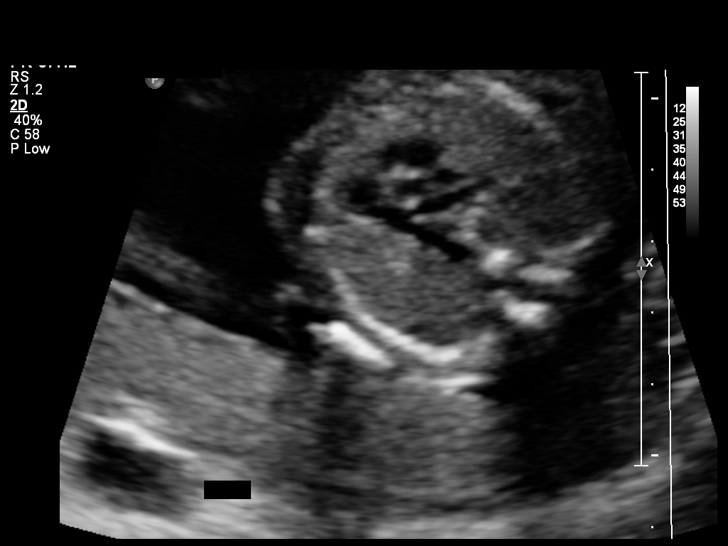
[im 50/72]
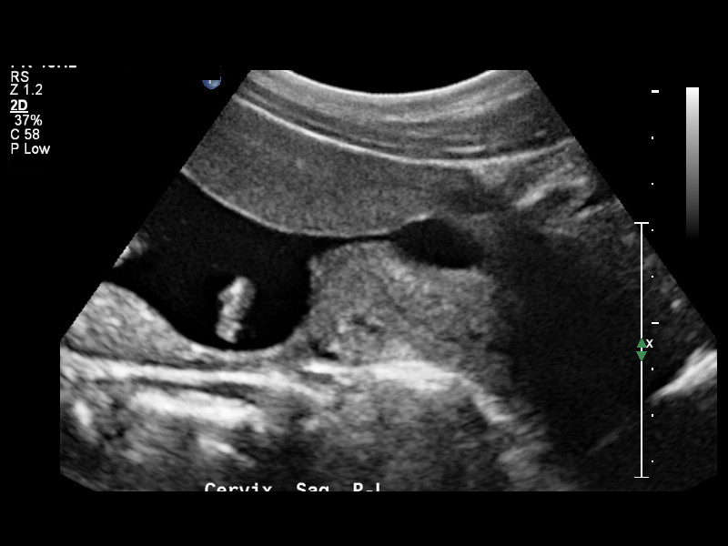
[im 58/72]
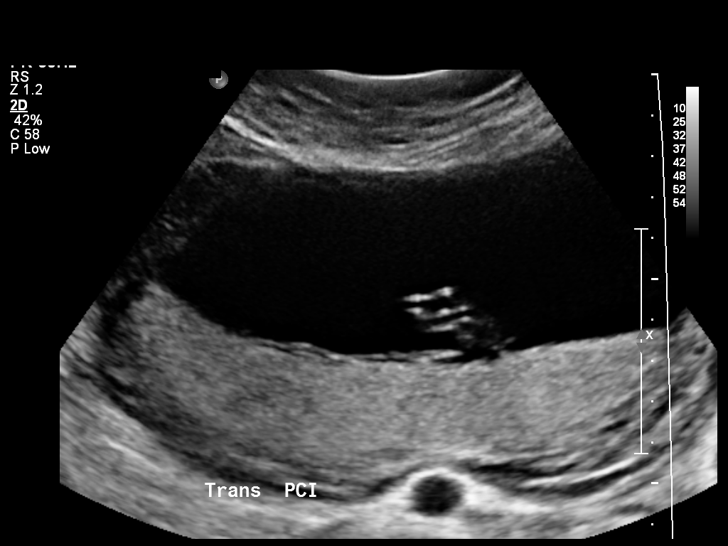
[im 64/72]
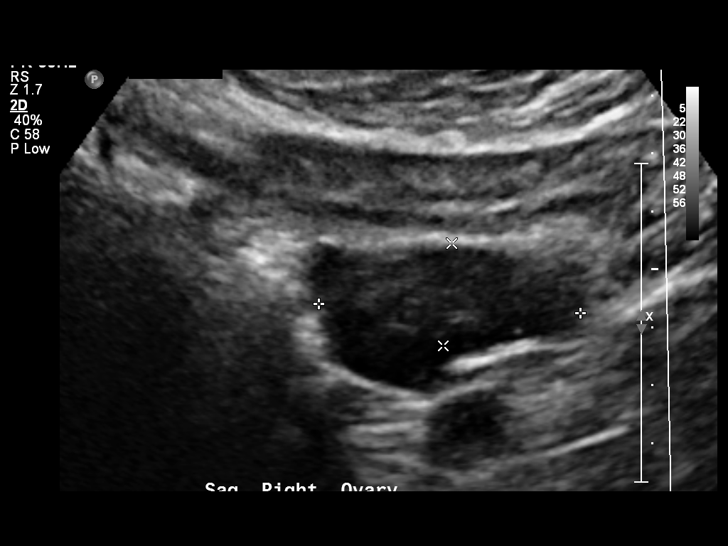
[im 69/72]
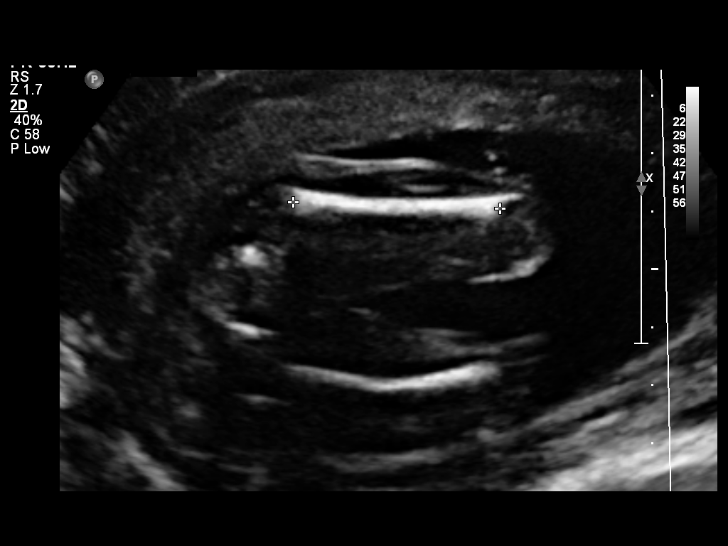

[12 of 28 positions shown; findings below may reference images not displayed]

OBSTETRICS REPORT
                      (Signed Final [DATE] [DATE])

Service(s) Provided

 US OB COMP + 14 WK                                    76805.1
Indications

 Basic anatomic survey                                 [1R]
Fetal Evaluation

 Num Of Fetuses:    1
 Preg. Location:    Intrauterine
 Fetal Heart Rate:  140                          bpm
 Cardiac Activity:  Observed
 Presentation:      Variable
 Placenta:          Posterior, above cervical
                    os
 P. Cord            Visualized, central
 Insertion:

 Amniotic Fluid
 AFI FV:      Subjectively within normal limits
                                              Larg Pckt:   5.02  cm
 LUQ:   5.02    cm
Biometry

 BPD:     49.3  mm     G. Age:  20w 6d                CI:        76.03   70 - 86
                                                      FL/HC:       19.9  16.8 -

 HC:     179.2  mm     G. Age:  20w 2d       39   %   HC/AC:       1.14  1.09 -

 AC:     157.2  mm     G. Age:  20w 6d       58   %   FL/BPD:
 FL:      35.6  mm     G. Age:  21w 2d       71   %   FL/AC:       22.6  20 - 24
 HUM:     34.5  mm     G. Age:  21w 6d       86   %
 CER:     21.6  mm     G. Age:  20w 4d       53   %
 Est. FW:     390   gm    0 lb 14 oz     55  %
Gestational Age

 LMP:           20w 3d        Date:  [DATE]                 EDD:   [DATE]
 U/S Today:     20w 6d                                        EDD:   [DATE]
 Best:          20w 3d     Det. By:  LMP  ([DATE])          EDD:   [DATE]
Anatomy
 Cranium:          Appears normal         Aortic Arch:      Appears normal
 Fetal Cavum:      Appears normal         Ductal Arch:      Appears normal
 Ventricles:       Appears normal         Diaphragm:        Appears normal
 Choroid Plexus:   Appears normal         Stomach:          Appears normal
 Cerebellum:       Appears normal         Abdomen:          Appears normal
 Posterior Fossa:  Appears normal         Abdominal Wall:   Appears nml (cord
                                                            insert, abd wall)
 Nuchal Fold:      Appears normal         Cord Vessels:     Appears normal (3
                                                            vessel cord)
 Face:             Appears normal         Kidneys:          Appear normal
                   (orbits and profile)
 Lips:             Appears normal         Bladder:          Appears normal
 Heart:            Appears normal         Spine:            Appears normal
                   (4CH, axis, and
                   situs)
 RVOT:             Appears normal         Lower             Appears normal
                                          Extremities:
 LVOT:             Appears normal         Upper             Appears normal
                                          Extremities:

 Other:  Heels visualized. Nasal bone visualized.
Targeted Anatomy

 Fetal Central Nervous System
 Lat. Ventricles:  7.5                    Cisterna Magna:
Cervix Uterus Adnexa

 Cervical Length:    4.41      cm

 Cervix:       Normal appearance by transabdominal scan.
 Uterus:       No abnormality visualized.
 Cul De Sac:   No free fluid seen.

 Left Ovary:    Size(cm) L: 1.99 x W: 1.36 x H: 2.85  Volume(cc): 4
                Within normal limits.
 Right Ovary:   Size(cm) L: 4.5 x W: 1.6 x H: 1.77  Volume(cc):
                Within normal limits.

 Adnexa:     No abnormality visualized.
Impression

 Single IUP at 20w 3d
 Normal fetal anatomic survey
 No markers associated with aneuploidy noted
 Posterior placenta without previa
 Normal amniotic fluid volume
Recommendations

 Follow-up ultrasounds as clinically indicated.

 questions or concerns.

## 2013-08-05 ENCOUNTER — Encounter: Payer: Self-pay | Admitting: *Deleted

## 2013-08-07 ENCOUNTER — Ambulatory Visit (INDEPENDENT_AMBULATORY_CARE_PROVIDER_SITE_OTHER): Payer: Medicaid Other | Admitting: Obstetrics and Gynecology

## 2013-08-07 VITALS — BP 113/73 | HR 90 | Temp 98.1°F | Wt 195.9 lb

## 2013-08-07 DIAGNOSIS — Z3491 Encounter for supervision of normal pregnancy, unspecified, first trimester: Secondary | ICD-10-CM

## 2013-08-07 DIAGNOSIS — Z3480 Encounter for supervision of other normal pregnancy, unspecified trimester: Secondary | ICD-10-CM

## 2013-08-07 LAB — POCT URINALYSIS DIP (DEVICE)
BILIRUBIN URINE: NEGATIVE
GLUCOSE, UA: NEGATIVE mg/dL
Hgb urine dipstick: NEGATIVE
Ketones, ur: NEGATIVE mg/dL
NITRITE: NEGATIVE
Protein, ur: NEGATIVE mg/dL
Specific Gravity, Urine: 1.02 (ref 1.005–1.030)
UROBILINOGEN UA: 2 mg/dL — AB (ref 0.0–1.0)
pH: 7.5 (ref 5.0–8.0)

## 2013-08-07 NOTE — Progress Notes (Signed)
Good FM. H/A's, probably tension. Has not tried Tylenol yet, discussed dosage limits and stress reduction. If H/As persist refer to Jannifer RodneyLinda Barefoot, NP.  Yeast sx resolved afte Diflucan. Has mild right CTS sx.  + UDS mj. States marijuana sidestream smoke only. Will do UDS later in pregnancy.

## 2013-08-07 NOTE — Patient Instructions (Signed)

## 2013-08-07 NOTE — Progress Notes (Signed)
Pt reports daily headache, rates 6-7 on scale 0-10, throbbing only subsides while she is eating.

## 2013-09-04 ENCOUNTER — Ambulatory Visit (INDEPENDENT_AMBULATORY_CARE_PROVIDER_SITE_OTHER): Payer: Medicaid Other | Admitting: Family

## 2013-09-04 VITALS — BP 123/78 | HR 96 | Wt 201.2 lb

## 2013-09-04 DIAGNOSIS — Z3480 Encounter for supervision of other normal pregnancy, unspecified trimester: Secondary | ICD-10-CM

## 2013-09-04 DIAGNOSIS — Z3491 Encounter for supervision of normal pregnancy, unspecified, first trimester: Secondary | ICD-10-CM

## 2013-09-04 LAB — POCT URINALYSIS DIP (DEVICE)
BILIRUBIN URINE: NEGATIVE
GLUCOSE, UA: NEGATIVE mg/dL
Hgb urine dipstick: NEGATIVE
KETONES UR: NEGATIVE mg/dL
NITRITE: NEGATIVE
Protein, ur: 30 mg/dL — AB
Specific Gravity, Urine: 1.02 (ref 1.005–1.030)
Urobilinogen, UA: 1 mg/dL (ref 0.0–1.0)
pH: 6 (ref 5.0–8.0)

## 2013-09-04 NOTE — Progress Notes (Signed)
No questions or concerns.  Reviewed carpal tunnel in pregnancy > recommended wrist braces.

## 2013-09-04 NOTE — Progress Notes (Signed)
Patient has numbness in her hands.

## 2013-09-26 ENCOUNTER — Encounter: Payer: Self-pay | Admitting: Obstetrics and Gynecology

## 2013-09-26 ENCOUNTER — Ambulatory Visit (INDEPENDENT_AMBULATORY_CARE_PROVIDER_SITE_OTHER): Payer: Medicaid Other | Admitting: Obstetrics and Gynecology

## 2013-09-26 VITALS — BP 115/73 | HR 99 | Temp 97.9°F | Wt 203.3 lb

## 2013-09-26 DIAGNOSIS — Z3480 Encounter for supervision of other normal pregnancy, unspecified trimester: Secondary | ICD-10-CM

## 2013-09-26 DIAGNOSIS — Z3491 Encounter for supervision of normal pregnancy, unspecified, first trimester: Secondary | ICD-10-CM

## 2013-09-26 DIAGNOSIS — Z23 Encounter for immunization: Secondary | ICD-10-CM

## 2013-09-26 LAB — POCT URINALYSIS DIP (DEVICE)
BILIRUBIN URINE: NEGATIVE
GLUCOSE, UA: NEGATIVE mg/dL
Hgb urine dipstick: NEGATIVE
Ketones, ur: NEGATIVE mg/dL
NITRITE: NEGATIVE
PH: 7 (ref 5.0–8.0)
Protein, ur: NEGATIVE mg/dL
Specific Gravity, Urine: 1.02 (ref 1.005–1.030)
Urobilinogen, UA: 1 mg/dL (ref 0.0–1.0)

## 2013-09-26 LAB — CBC
HCT: 32.5 % — ABNORMAL LOW (ref 36.0–46.0)
HEMOGLOBIN: 11 g/dL — AB (ref 12.0–15.0)
MCH: 29.1 pg (ref 26.0–34.0)
MCHC: 33.8 g/dL (ref 30.0–36.0)
MCV: 86 fL (ref 78.0–100.0)
PLATELETS: 317 10*3/uL (ref 150–400)
RBC: 3.78 MIL/uL — AB (ref 3.87–5.11)
RDW: 14.1 % (ref 11.5–15.5)
WBC: 12.2 10*3/uL — AB (ref 4.0–10.5)

## 2013-09-26 MED ORDER — TETANUS-DIPHTH-ACELL PERTUSSIS 5-2.5-18.5 LF-MCG/0.5 IM SUSP: 0.5000 mL | Freq: Once | INTRAMUSCULAR | Status: DC

## 2013-09-26 NOTE — Progress Notes (Signed)
TDAP and 28 wk labs today. Encouraged to try breastfeeding g

## 2013-09-26 NOTE — Progress Notes (Signed)
Edema in feet and hands.  1hr gtt and labs today. Tdap today.

## 2013-09-27 LAB — RPR

## 2013-09-27 LAB — GLUCOSE TOLERANCE, 1 HOUR (50G) W/O FASTING: GLUCOSE 1 HOUR GTT: 86 mg/dL (ref 70–140)

## 2013-09-27 LAB — HIV ANTIBODY (ROUTINE TESTING W REFLEX): HIV: NONREACTIVE

## 2013-10-10 ENCOUNTER — Ambulatory Visit (INDEPENDENT_AMBULATORY_CARE_PROVIDER_SITE_OTHER): Payer: Medicaid Other | Admitting: Obstetrics and Gynecology

## 2013-10-10 ENCOUNTER — Encounter: Payer: Self-pay | Admitting: Obstetrics and Gynecology

## 2013-10-10 VITALS — BP 116/77 | HR 98 | Temp 98.8°F | Wt 202.5 lb

## 2013-10-10 DIAGNOSIS — Z3491 Encounter for supervision of normal pregnancy, unspecified, first trimester: Secondary | ICD-10-CM

## 2013-10-10 DIAGNOSIS — Z3480 Encounter for supervision of other normal pregnancy, unspecified trimester: Secondary | ICD-10-CM

## 2013-10-10 DIAGNOSIS — Z369 Encounter for antenatal screening, unspecified: Secondary | ICD-10-CM

## 2013-10-10 DIAGNOSIS — Z36 Encounter for antenatal screening of mother: Secondary | ICD-10-CM

## 2013-10-10 LAB — POCT URINALYSIS DIP (DEVICE)
GLUCOSE, UA: NEGATIVE mg/dL
Hgb urine dipstick: NEGATIVE
KETONES UR: NEGATIVE mg/dL
Nitrite: NEGATIVE
PROTEIN: 30 mg/dL — AB
Specific Gravity, Urine: 1.025 (ref 1.005–1.030)
Urobilinogen, UA: 4 mg/dL — ABNORMAL HIGH (ref 0.0–1.0)
pH: 6 (ref 5.0–8.0)

## 2013-10-10 NOTE — Progress Notes (Signed)
Patient reports wrist pain and lower back pain

## 2013-10-10 NOTE — Progress Notes (Signed)
Patient is doing well without complaints. Advised to use wrist splint for wrist pain. FM/PTL precautions reviewed.

## 2013-10-24 ENCOUNTER — Encounter: Payer: Self-pay | Admitting: Obstetrics & Gynecology

## 2013-10-24 ENCOUNTER — Ambulatory Visit (INDEPENDENT_AMBULATORY_CARE_PROVIDER_SITE_OTHER): Payer: Medicaid Other | Admitting: Obstetrics & Gynecology

## 2013-10-24 VITALS — BP 120/71 | HR 95 | Temp 98.5°F | Wt 204.4 lb

## 2013-10-24 DIAGNOSIS — Z3480 Encounter for supervision of other normal pregnancy, unspecified trimester: Secondary | ICD-10-CM

## 2013-10-24 DIAGNOSIS — Z3491 Encounter for supervision of normal pregnancy, unspecified, first trimester: Secondary | ICD-10-CM

## 2013-10-24 LAB — POCT URINALYSIS DIP (DEVICE)
GLUCOSE, UA: NEGATIVE mg/dL
Hgb urine dipstick: NEGATIVE
Ketones, ur: NEGATIVE mg/dL
NITRITE: NEGATIVE
Protein, ur: 30 mg/dL — AB
SPECIFIC GRAVITY, URINE: 1.025 (ref 1.005–1.030)
UROBILINOGEN UA: 2 mg/dL — AB (ref 0.0–1.0)
pH: 6.5 (ref 5.0–8.0)

## 2013-10-24 NOTE — Patient Instructions (Addendum)
Third Trimester of Pregnancy The third trimester is from week 29 through week 42, months 7 through 9. The third trimester is a time when the fetus is growing rapidly. At the end of the ninth month, the fetus is about 20 inches in length and weighs 6-10 pounds.  BODY CHANGES Your body goes through many changes during pregnancy. The changes vary from woman to woman.   Your weight will continue to increase. You can expect to gain 25-35 pounds (11-16 kg) by the end of the pregnancy.  You may begin to get stretch marks on your hips, abdomen, and breasts.  You may urinate more often because the fetus is moving lower into your pelvis and pressing on your bladder.  You may develop or continue to have heartburn as a result of your pregnancy.  You may develop constipation because certain hormones are causing the muscles that push waste through your intestines to slow down.  You may develop hemorrhoids or swollen, bulging veins (varicose veins).  You may have pelvic pain because of the weight gain and pregnancy hormones relaxing your joints between the bones in your pelvis. Backaches may result from overexertion of the muscles supporting your posture.  You may have changes in your hair. These can include thickening of your hair, rapid growth, and changes in texture. Some women also have hair loss during or after pregnancy, or hair that feels dry or thin. Your hair will most likely return to normal after your baby is born.  Your breasts will continue to grow and be tender. A yellow discharge may leak from your breasts called colostrum.  Your belly button may stick out.  You may feel short of breath because of your expanding uterus.  You may notice the fetus "dropping," or moving lower in your abdomen.  You may have a bloody mucus discharge. This usually occurs a few days to a week before labor begins.  Your cervix becomes thin and soft (effaced) near your due date. WHAT TO EXPECT AT YOUR PRENATAL  EXAMS  You will have prenatal exams every 2 weeks until week 36. Then, you will have weekly prenatal exams. During a routine prenatal visit:  You will be weighed to make sure you and the fetus are growing normally.  Your blood pressure is taken.  Your abdomen will be measured to track your baby's growth.  The fetal heartbeat will be listened to.  Any test results from the previous visit will be discussed.  You may have a cervical check near your due date to see if you have effaced. At around 36 weeks, your caregiver will check your cervix. At the same time, your caregiver will also perform a test on the secretions of the vaginal tissue. This test is to determine if a type of bacteria, Group B streptococcus, is present. Your caregiver will explain this further. Your caregiver may ask you:  What your birth plan is.  How you are feeling.  If you are feeling the baby move.  If you have had any abnormal symptoms, such as leaking fluid, bleeding, severe headaches, or abdominal cramping.  If you have any questions. Other tests or screenings that may be performed during your third trimester include:  Blood tests that check for low iron levels (anemia).  Fetal testing to check the health, activity level, and growth of the fetus. Testing is done if you have certain medical conditions or if there are problems during the pregnancy. FALSE LABOR You may feel small, irregular contractions that   eventually go away. These are called Braxton Hicks contractions, or false labor. Contractions may last for hours, days, or even weeks before true labor sets in. If contractions come at regular intervals, intensify, or become painful, it is best to be seen by your caregiver.  SIGNS OF LABOR   Menstrual-like cramps.  Contractions that are 5 minutes apart or less.  Contractions that start on the top of the uterus and spread down to the lower abdomen and back.  A sense of increased pelvic pressure or back  pain.  A watery or bloody mucus discharge that comes from the vagina. If you have any of these signs before the 37th week of pregnancy, call your caregiver right away. You need to go to the hospital to get checked immediately. HOME CARE INSTRUCTIONS   Avoid all smoking, herbs, alcohol, and unprescribed drugs. These chemicals affect the formation and growth of the baby.  Follow your caregiver's instructions regarding medicine use. There are medicines that are either safe or unsafe to take during pregnancy.  Exercise only as directed by your caregiver. Experiencing uterine cramps is a good sign to stop exercising.  Continue to eat regular, healthy meals.  Wear a good support bra for breast tenderness.  Do not use hot tubs, steam rooms, or saunas.  Wear your seat belt at all times when driving.  Avoid raw meat, uncooked cheese, cat litter boxes, and soil used by cats. These carry germs that can cause birth defects in the baby.  Take your prenatal vitamins.  Try taking a stool softener (if your caregiver approves) if you develop constipation. Eat more high-fiber foods, such as fresh vegetables or fruit and whole grains. Drink plenty of fluids to keep your urine clear or pale yellow.  Take warm sitz baths to soothe any pain or discomfort caused by hemorrhoids. Use hemorrhoid cream if your caregiver approves.  If you develop varicose veins, wear support hose. Elevate your feet for 15 minutes, 3-4 times a day. Limit salt in your diet.  Avoid heavy lifting, wear low heal shoes, and practice good posture.  Rest a lot with your legs elevated if you have leg cramps or low back pain.  Visit your dentist if you have not gone during your pregnancy. Use a soft toothbrush to brush your teeth and be gentle when you floss.  A sexual relationship may be continued unless your caregiver directs you otherwise.  Do not travel far distances unless it is absolutely necessary and only with the approval  of your caregiver.  Take prenatal classes to understand, practice, and ask questions about the labor and delivery.  Make a trial run to the hospital.  Pack your hospital bag.  Prepare the baby's nursery.  Continue to go to all your prenatal visits as directed by your caregiver. SEEK MEDICAL CARE IF:  You are unsure if you are in labor or if your water has broken.  You have dizziness.  You have mild pelvic cramps, pelvic pressure, or nagging pain in your abdominal area.  You have persistent nausea, vomiting, or diarrhea.  You have a bad smelling vaginal discharge.  You have pain with urination. SEEK IMMEDIATE MEDICAL CARE IF:   You have a fever.  You are leaking fluid from your vagina.  You have spotting or bleeding from your vagina.  You have severe abdominal cramping or pain.  You have rapid weight loss or gain.  You have shortness of breath with chest pain.  You notice sudden or extreme swelling   of your face, hands, ankles, feet, or legs.  You have not felt your baby move in over an hour.  You have severe headaches that do not go away with medicine.  You have vision changes. Document Released: 03/22/2001 Document Revised: 04/02/2013 Document Reviewed: 05/29/2012 [facility]Exit[facility] Patient Information 2015 [place] SpringsExitCare, [place]. This information is not intended to replace advice given to you by your health care provider. Make sure you discuss any questions you have with your health care provider. Levonorgestrel intrauterine device (IUD) What is this medicine? LEVONORGESTREL IUD (LEE voe nor jes trel) is a contraceptive (birth control) device. The device is placed inside the uterus by a healthcare professional. It is used to prevent pregnancy and can also be used to treat heavy bleeding that occurs during your period. Depending on the device, it can be used for 3 to 5 years. This medicine may be used for other purposes; ask your health care provider or pharmacist if you have  questions. COMMON BRAND NAME(S): Gretta CoolMirena, Skyla What should I tell my health care provider before I take this medicine? They need to know if you have any of these conditions: -abnormal Pap smear -cancer of the breast, uterus, or cervix -diabetes -endometritis -genital or pelvic infection now or in the past -have more than one sexual partner or your partner has more than one partner -heart disease -history of an ectopic or tubal pregnancy -immune system problems -IUD in place -liver disease or tumor -problems with blood clots or take blood-thinners -use intravenous drugs -uterus of unusual shape -vaginal bleeding that has not been explained -an unusual or allergic reaction to levonorgestrel, other hormones, silicone, or polyethylene, medicines, foods, dyes, or preservatives -pregnant or trying to get pregnant -breast-feeding How should I use this medicine? This device is placed inside the uterus by a health care professional. Talk to your pediatrician regarding the use of this medicine in children. Special care may be needed. Overdosage: If you think you have taken too much of this medicine contact a poison control center or emergency room at once. NOTE: This medicine is only for you. Do not share this medicine with others. What if I miss a dose? This does not apply. What may interact with this medicine? Do not take this medicine with any of the following medications: -amprenavir -bosentan -fosamprenavir This medicine may also interact with the following medications: -aprepitant -barbiturate medicines for inducing sleep or treating seizures -bexarotene -griseofulvin -medicines to treat seizures like carbamazepine, ethotoin, felbamate, oxcarbazepine, phenytoin, topiramate -modafinil -pioglitazone -rifabutin -rifampin -rifapentine -some medicines to treat HIV infection like atazanavir, indinavir, lopinavir, nelfinavir, tipranavir, ritonavir -St. John's wort -warfarin This  list may not describe all possible interactions. Give your health care provider a list of all the medicines, herbs, non-prescription drugs, or dietary supplements you use. Also tell them if you smoke, drink alcohol, or use illegal drugs. Some items may interact with your medicine. What should I watch for while using this medicine? Visit your doctor or health care professional for regular check ups. See your doctor if you or your partner has sexual contact with others, becomes HIV positive, or gets a sexual transmitted disease. This product does not protect you against HIV infection (AIDS) or other sexually transmitted diseases. You can check the placement of the IUD yourself by reaching up to the top of your vagina with clean fingers to feel the threads. Do not pull on the threads. It is a good habit to check placement after each menstrual period. Call your doctor right away  if you feel more of the IUD than just the threads or if you cannot feel the threads at all. The IUD may come out by itself. You may become pregnant if the device comes out. If you notice that the IUD has come out use a backup birth control method like condoms and call your health care provider. Using tampons will not change the position of the IUD and are okay to use during your period. What side effects may I notice from receiving this medicine? Side effects that you should report to your doctor or health care professional as soon as possible: -allergic reactions like skin rash, itching or hives, swelling of the face, lips, or tongue -fever, flu-like symptoms -genital sores -high blood pressure -no menstrual period for 6 weeks during use -pain, swelling, warmth in the leg -pelvic pain or tenderness -severe or sudden headache -signs of pregnancy -stomach cramping -sudden shortness of breath -trouble with balance, talking, or walking -unusual vaginal bleeding, discharge -yellowing of the eyes or skin Side effects that  usually do not require medical attention (report to your doctor or health care professional if they continue or are bothersome): -acne -breast pain -change in sex drive or performance -changes in weight -cramping, dizziness, or faintness while the device is being inserted -headache -irregular menstrual bleeding within first 3 to 6 months of use -nausea This list may not describe all possible side effects. Call your doctor for medical advice about side effects. You may report side effects to FDA at 1-800-FDA-1088. Where should I keep my medicine? This does not apply. NOTE: This sheet is a summary. It may not cover all possible information. If you have questions about this medicine, talk to your doctor, pharmacist, or health care provider.  2015, [employer]. (2011-04-28 13:54:04)

## 2013-10-24 NOTE — Progress Notes (Signed)
Reports occasional edema in hands and wrists/hands. C/o wrists hurt and hands go numb.

## 2013-10-24 NOTE — Progress Notes (Signed)
Pt c/o occ B-H ctx. NO LOF, No VB encouraged increased fluid intake Cock up splints for wrist at night

## 2013-10-28 ENCOUNTER — Encounter: Payer: Medicaid Other | Admitting: Obstetrics & Gynecology

## 2013-11-05 ENCOUNTER — Inpatient Hospital Stay (HOSPITAL_COMMUNITY)
Admission: AD | Admit: 2013-11-05 | Discharge: 2013-11-05 | Disposition: A | Discharge status: Home / Self Care | Payer: Medicaid Other | Source: Ambulatory Visit | Attending: Obstetrics & Gynecology | Admitting: Obstetrics & Gynecology

## 2013-11-05 ENCOUNTER — Encounter (HOSPITAL_COMMUNITY): Payer: Self-pay | Admitting: *Deleted

## 2013-11-05 DIAGNOSIS — O3090 Multiple gestation, unspecified, unspecified trimester: Secondary | ICD-10-CM

## 2013-11-05 DIAGNOSIS — Z87891 Personal history of nicotine dependence: Secondary | ICD-10-CM | POA: Diagnosis not present

## 2013-11-05 DIAGNOSIS — O368190 Decreased fetal movements, unspecified trimester, not applicable or unspecified: Secondary | ICD-10-CM | POA: Diagnosis not present

## 2013-11-05 DIAGNOSIS — O368131 Decreased fetal movements, third trimester, fetus 1: Secondary | ICD-10-CM

## 2013-11-05 NOTE — MAU Provider Note (Signed)
Attestation of Attending Supervision of Advanced Practitioner (CNM/NP): Evaluation and management procedures were performed by the Advanced Practitioner under my supervision and collaboration. I have reviewed the Advanced Practitioner's note and chart, and I agree with the management and plan.  Santana Edell H. 2:45 PM

## 2013-11-05 NOTE — Discharge Instructions (Signed)

## 2013-11-05 NOTE — MAU Note (Signed)
Patient states she has not felt any fetal movement since yesterday afternoon. Denies bleeding, leaking or contractions.

## 2013-11-05 NOTE — MAU Provider Note (Signed)
  History     CSN: [id number]  Arrival date and time: 11/05/13 0709   First Provider Initiated Contact with Patient 11/05/13 0801      Chief Complaint  Patient presents with  . Decreased Fetal Movement   HPI  Pt is a 28 yo G1P0 at 6163w0d wks IUP here with report of decreased fetal movement since last night.  Since arriving to MAU has felt movement.  No other problems or concerns.  Denies contractions, leaking of fluid, or bleeding.    History reviewed. No pertinent past medical history.  Past Surgical History  Procedure Laterality Date  . Knee surgery Left 2001  . Wisdom tooth extraction      Family History  Problem Relation Age of Onset  . Hypertension Mother   . Diabetes Maternal Grandfather     History  Substance Use Topics  . Smoking status: Former Games developermoker  . Smokeless tobacco: Not on file  . Alcohol Use: No    Allergies:  Allergies  Allergen Reactions  . Shellfish Allergy Anaphylaxis    Prescriptions prior to admission  Medication Sig Dispense Refill  . Multiple Vitamins-Minerals (MULTIVITAMIN GUMMIES ADULTS) CHEW Chew 1 tablet by mouth daily.        Review of Systems  Constitutional:       Decreased fetal movement  All other systems reviewed and are negative.  Physical Exam   Blood pressure 121/79, pulse 99, temperature 99 F (37.2 C), temperature source Oral, resp. rate 16, height 5' 4.5" (1.638 m), weight 94.076 kg (207 lb 6.4 oz), last menstrual period 03/12/2013.  Physical Exam  Constitutional: She is oriented to person, place, and time. She appears well-developed and well-nourished. No distress.  HENT:  Head: Normocephalic.  Neck: Normal range of motion. Neck supple.  Cardiovascular: Normal rate, regular rhythm and normal heart sounds.   Respiratory: Effort normal and breath sounds normal. No respiratory distress.  Musculoskeletal: Normal range of motion. She exhibits no edema.  Neurological: She is alert and oriented to person, place, and  time. She has normal reflexes.  Skin: Skin is warm and dry.   FHR 130's, +accels Toco x 1  MAU Course  Procedures  Assessment and Plan  28 yo G1P0 at 3263w0d weeks IUP Category I FHR Tracing  Plan: Discharge to home Provide reassurance Discussed kick counts  KARIM, Bedelia Pong N 11/05/2013, 8:04 AM

## 2013-11-07 ENCOUNTER — Ambulatory Visit (INDEPENDENT_AMBULATORY_CARE_PROVIDER_SITE_OTHER): Payer: Medicaid Other | Admitting: Family

## 2013-11-07 VITALS — BP 104/87 | HR 101 | Temp 98.1°F | Wt 205.9 lb

## 2013-11-07 DIAGNOSIS — Z3480 Encounter for supervision of other normal pregnancy, unspecified trimester: Secondary | ICD-10-CM

## 2013-11-07 DIAGNOSIS — Z3491 Encounter for supervision of normal pregnancy, unspecified, first trimester: Secondary | ICD-10-CM

## 2013-11-07 LAB — POCT URINALYSIS DIP (DEVICE)
Bilirubin Urine: NEGATIVE
Glucose, UA: NEGATIVE mg/dL
Nitrite: NEGATIVE
PROTEIN: 30 mg/dL — AB
Specific Gravity, Urine: 1.025 (ref 1.005–1.030)
UROBILINOGEN UA: 2 mg/dL — AB (ref 0.0–1.0)
pH: 6 (ref 5.0–8.0)

## 2013-11-07 MED ORDER — NITROFURANTOIN MONOHYD MACRO 100 MG PO CAPS: 100.0000 mg | ORAL_CAPSULE | Freq: Two times a day (BID) | ORAL | Status: DC

## 2013-11-07 NOTE — Progress Notes (Signed)
Reports occasional edema in feet and intermittent pelvic pressure.

## 2013-11-07 NOTE — Progress Notes (Signed)
Large leuks in urine, no symptoms other than itching.  RX Macrobid, urine culture sent. Pelvic pressure, no report of vaginal bleeding or leaking of fluid.

## 2013-11-10 LAB — CULTURE, OB URINE

## 2013-11-27 ENCOUNTER — Other Ambulatory Visit: Payer: Self-pay | Admitting: Physician Assistant

## 2013-11-27 ENCOUNTER — Ambulatory Visit (INDEPENDENT_AMBULATORY_CARE_PROVIDER_SITE_OTHER): Payer: Medicaid Other | Admitting: Physician Assistant

## 2013-11-27 VITALS — BP 132/83 | HR 91 | Wt 208.0 lb

## 2013-11-27 DIAGNOSIS — Z3480 Encounter for supervision of other normal pregnancy, unspecified trimester: Secondary | ICD-10-CM

## 2013-11-27 DIAGNOSIS — Z3491 Encounter for supervision of normal pregnancy, unspecified, first trimester: Secondary | ICD-10-CM

## 2013-11-27 LAB — POCT URINALYSIS DIP (DEVICE)
Bilirubin Urine: NEGATIVE
GLUCOSE, UA: NEGATIVE mg/dL
Ketones, ur: NEGATIVE mg/dL
NITRITE: NEGATIVE
Protein, ur: NEGATIVE mg/dL
Specific Gravity, Urine: 1.015 (ref 1.005–1.030)
UROBILINOGEN UA: 1 mg/dL (ref 0.0–1.0)
pH: 7 (ref 5.0–8.0)

## 2013-11-27 LAB — OB RESULTS CONSOLE GC/CHLAMYDIA
Chlamydia: NEGATIVE
GC PROBE AMP, GENITAL: NEGATIVE

## 2013-11-27 LAB — OB RESULTS CONSOLE GBS: STREP GROUP B AG: POSITIVE

## 2013-11-27 NOTE — Addendum Note (Signed)
Addended by: Sherre LainASH, AMANDA A on: 11/27/2013 04:28 PM   Modules accepted: Orders

## 2013-11-27 NOTE — Progress Notes (Signed)
37 weeks, large amt of discharge present - frothy, white Labor precautions Continue PNV GBS, wet prep, cultures pending RTC 1 week

## 2013-11-27 NOTE — Patient Instructions (Signed)

## 2013-11-28 LAB — WET PREP, GENITAL
Clue Cells Wet Prep HPF POC: NONE SEEN
Trich, Wet Prep: NONE SEEN
Yeast Wet Prep HPF POC: NONE SEEN

## 2013-11-28 LAB — GC/CHLAMYDIA PROBE AMP
CT Probe RNA: NEGATIVE
GC PROBE AMP APTIMA: NEGATIVE

## 2013-11-29 LAB — URINE CULTURE

## 2013-11-29 LAB — CULTURE, BETA STREP (GROUP B ONLY)

## 2013-12-04 ENCOUNTER — Ambulatory Visit (INDEPENDENT_AMBULATORY_CARE_PROVIDER_SITE_OTHER): Payer: Medicaid Other | Admitting: Advanced Practice Midwife

## 2013-12-04 ENCOUNTER — Encounter: Payer: Self-pay | Admitting: *Deleted

## 2013-12-04 VITALS — BP 119/62 | HR 90 | Wt 208.9 lb

## 2013-12-04 DIAGNOSIS — Z3480 Encounter for supervision of other normal pregnancy, unspecified trimester: Secondary | ICD-10-CM

## 2013-12-04 DIAGNOSIS — Z3491 Encounter for supervision of normal pregnancy, unspecified, first trimester: Secondary | ICD-10-CM

## 2013-12-04 LAB — POCT URINALYSIS DIP (DEVICE)
Glucose, UA: NEGATIVE mg/dL
KETONES UR: NEGATIVE mg/dL
Nitrite: NEGATIVE
PROTEIN: NEGATIVE mg/dL
Specific Gravity, Urine: 1.02 (ref 1.005–1.030)
Urobilinogen, UA: 4 mg/dL — ABNORMAL HIGH (ref 0.0–1.0)
pH: 7 (ref 5.0–8.0)

## 2013-12-04 NOTE — Progress Notes (Signed)
FMLA paperwork complete 

## 2013-12-04 NOTE — Patient Instructions (Signed)
Braxton Hicks Contractions Contractions of the uterus can occur throughout pregnancy. Contractions are not always a sign that you are in labor.  WHAT ARE BRAXTON HICKS CONTRACTIONS?  Contractions that occur before labor are called Braxton Hicks contractions, or false labor. Toward the end of pregnancy (32-34 weeks), these contractions can develop more often and may become more forceful. This is not true labor because these contractions do not result in opening (dilatation) and thinning of the cervix. They are sometimes difficult to tell apart from true labor because these contractions can be forceful and people have different pain tolerances. You should not feel embarrassed if you go to the hospital with false labor. Sometimes, the only way to tell if you are in true labor is for your health care provider to look for changes in the cervix. If there are no prenatal problems or other health problems associated with the pregnancy, it is completely safe to be sent home with false labor and await the onset of true labor. HOW CAN YOU TELL THE DIFFERENCE BETWEEN TRUE AND FALSE LABOR? False Labor  The contractions of false labor are usually shorter and not as hard as those of true labor.   The contractions are usually irregular.   The contractions are often felt in the front of the lower abdomen and in the groin.   The contractions may go away when you walk around or change positions while lying down.   The contractions get weaker and are shorter lasting as time goes on.   The contractions do not usually become progressively stronger, regular, and closer together as with true labor.  True Labor  Contractions in true labor last 30-70 seconds, become very regular, usually become more intense, and increase in frequency.   The contractions do not go away with walking.   The discomfort is usually felt in the top of the uterus and spreads to the lower abdomen and low back.   True labor can be  determined by your health care provider with an exam. This will show that the cervix is dilating and getting thinner.  WHAT TO REMEMBER  Keep up with your usual exercises and follow other instructions given by your health care provider.   Take medicines as directed by your health care provider.   Keep your regular prenatal appointments.   Eat and drink lightly if you think you are going into labor.   If Braxton Hicks contractions are making you uncomfortable:   Change your position from lying down or resting to walking, or from walking to resting.   Sit and rest in a tub of warm water.   Drink 2-3 glasses of water. Dehydration may cause these contractions.   Do slow and deep breathing several times an hour.  WHEN SHOULD I SEEK IMMEDIATE MEDICAL CARE? Seek immediate medical care if:  Your contractions become stronger, more regular, and closer together.   You have fluid leaking or gushing from your vagina.   You have a fever.   You pass blood-tinged mucus.   You have vaginal bleeding.   You have continuous abdominal pain.   You have low back pain that you never had before.   You feel your baby's head pushing down and causing pelvic pressure.   Your baby is not moving as much as it used to.  Document Released: 03/28/2005 Document Revised: 04/02/2013 Document Reviewed: 01/07/2013 Exit[employer] Patient Information 2015 [employer]. This information is not intended to replace advice given to you by your health care   provider. Make sure you discuss any questions you have with your health care provider.  Breastfeeding Deciding to breastfeed is one of the best choices you can make for you and your baby. A change in hormones during pregnancy causes your breast tissue to grow and increases the number and size of your milk ducts. These hormones also allow proteins, sugars, and fats from your blood supply to make breast milk in your milk-producing glands. Hormones  prevent breast milk from being released before your baby is born as well as prompt milk flow after birth. Once breastfeeding has begun, thoughts of your baby, as well as his or her sucking or crying, can stimulate the release of milk from your milk-producing glands.  BENEFITS OF BREASTFEEDING For Your Baby  Your first milk (colostrum) helps your baby's digestive system function better.   There are antibodies in your milk that help your baby fight off infections.   Your baby has a lower incidence of asthma, allergies, and sudden infant death syndrome.   The nutrients in breast milk are better for your baby than infant formulas and are designed uniquely for your baby's needs.   Breast milk improves your baby's brain development.   Your baby is less likely to develop other conditions, such as childhood obesity, asthma, or type 2 diabetes mellitus.  For You   Breastfeeding helps to create a very special bond between you and your baby.   Breastfeeding is convenient. Breast milk is always available at the correct temperature and costs nothing.   Breastfeeding helps to burn calories and helps you lose the weight gained during pregnancy.   Breastfeeding makes your uterus contract to its prepregnancy size faster and slows bleeding (lochia) after you give birth.   Breastfeeding helps to lower your risk of developing type 2 diabetes mellitus, osteoporosis, and breast or ovarian cancer later in life. SIGNS THAT YOUR BABY IS HUNGRY Early Signs of Hunger  Increased alertness or activity.  Stretching.  Movement of the head from side to side.  Movement of the head and opening of the mouth when the corner of the mouth or cheek is stroked (rooting).  Increased sucking sounds, smacking lips, cooing, sighing, or squeaking.  Hand-to-mouth movements.  Increased sucking of fingers or hands. Late Signs of Hunger  Fussing.  Intermittent crying. Extreme Signs of Hunger Signs of  extreme hunger will require calming and consoling before your baby will be able to breastfeed successfully. Do not wait for the following signs of extreme hunger to occur before you initiate breastfeeding:   Restlessness.  A loud, strong cry.   Screaming. BREASTFEEDING BASICS Breastfeeding Initiation  Find a comfortable place to sit or lie down, with your neck and back well supported.  Place a pillow or rolled up blanket under your baby to bring him or her to the level of your breast (if you are seated). Nursing pillows are specially designed to help support your arms and your baby while you breastfeed.  Make sure that your baby's abdomen is facing your abdomen.   Gently massage your breast. With your fingertips, massage from your chest wall toward your nipple in a circular motion. This encourages milk flow. You may need to continue this action during the feeding if your milk flows slowly.  Support your breast with 4 fingers underneath and your thumb above your nipple. Make sure your fingers are well away from your nipple and your baby's mouth.   Stroke your baby's lips gently with your finger or nipple.     When your baby's mouth is open wide enough, quickly bring your baby to your breast, placing your entire nipple and as much of the colored area around your nipple (areola) as possible into your baby's mouth.   More areola should be visible above your baby's upper lip than below the lower lip.   Your baby's tongue should be between his or her lower gum and your breast.   Ensure that your baby's mouth is correctly positioned around your nipple (latched). Your baby's lips should create a seal on your breast and be turned out (everted).  It is common for your baby to suck about 2-3 minutes in order to start the flow of breast milk. Latching Teaching your baby how to latch on to your breast properly is very important. An improper latch can cause nipple pain and decreased milk  supply for you and poor weight gain in your baby. Also, if your baby is not latched onto your nipple properly, he or she may swallow some air during feeding. This can make your baby fussy. Burping your baby when you switch breasts during the feeding can help to get rid of the air. However, teaching your baby to latch on properly is still the best way to prevent fussiness from swallowing air while breastfeeding. Signs that your baby has successfully latched on to your nipple:    Silent tugging or silent sucking, without causing you pain.   Swallowing heard between every 3-4 sucks.    Muscle movement above and in front of his or her ears while sucking.  Signs that your baby has not successfully latched on to nipple:   Sucking sounds or smacking sounds from your baby while breastfeeding.  Nipple pain. If you think your baby has not latched on correctly, slip your finger into the corner of your baby's mouth to break the suction and place it between your baby's gums. Attempt breastfeeding initiation again. Signs of Successful Breastfeeding Signs from your baby:   A gradual decrease in the number of sucks or complete cessation of sucking.   Falling asleep.   Relaxation of his or her body.   Retention of a small amount of milk in his or her mouth.   Letting go of your breast by himself or herself. Signs from you:  Breasts that have increased in firmness, weight, and size 1-3 hours after feeding.   Breasts that are softer immediately after breastfeeding.  Increased milk volume, as well as a change in milk consistency and color by the fifth day of breastfeeding.   Nipples that are not sore, cracked, or bleeding. Signs That Your Baby is Getting Enough Milk  Wetting at least 3 diapers in a 24-hour period. The urine should be clear and pale yellow by age 5 days.  At least 3 stools in a 24-hour period by age 5 days. The stool should be soft and yellow.  At least 3 stools in a  24-hour period by age 7 days. The stool should be seedy and yellow.  No loss of weight greater than 10% of birth weight during the first 3 days of age.  Average weight gain of 4-7 ounces (113-198 g) per week after age 4 days.  Consistent daily weight gain by age 5 days, without weight loss after the age of 2 weeks. After a feeding, your baby may spit up a small amount. This is common. BREASTFEEDING FREQUENCY AND DURATION Frequent feeding will help you make more milk and can prevent sore nipples and breast   engorgement. Breastfeed when you feel the need to reduce the fullness of your breasts or when your baby shows signs of hunger. This is called "breastfeeding on demand." Avoid introducing a pacifier to your baby while you are working to establish breastfeeding (the first 4-6 weeks after your baby is born). After this time you may choose to use a pacifier. Research has shown that pacifier use during the first year of a baby's life decreases the risk of sudden infant death syndrome (SIDS). Allow your baby to feed on each breast as long as he or she wants. Breastfeed until your baby is finished feeding. When your baby unlatches or falls asleep while feeding from the first breast, offer the second breast. Because newborns are often sleepy in the first few weeks of life, you may need to awaken your baby to get him or her to feed. Breastfeeding times will vary from baby to baby. However, the following rules can serve as a guide to help you ensure that your baby is properly fed:  Newborns (babies 4 weeks of age or younger) may breastfeed every 1-3 hours.  Newborns should not go longer than 3 hours during the day or 5 hours during the night without breastfeeding.  You should breastfeed your baby a minimum of 8 times in a 24-hour period until you begin to introduce solid foods to your baby at around 6 months of age. BREAST MILK PUMPING Pumping and storing breast milk allows you to ensure that your baby is  exclusively fed your breast milk, even at times when you are unable to breastfeed. This is especially important if you are going back to work while you are still breastfeeding or when you are not able to be present during feedings. Your lactation consultant can give you guidelines on how long it is safe to store breast milk.  A breast pump is a machine that allows you to pump milk from your breast into a sterile bottle. The pumped breast milk can then be stored in a refrigerator or freezer. Some breast pumps are operated by hand, while others use electricity. Ask your lactation consultant which type will work best for you. Breast pumps can be purchased, but some hospitals and breastfeeding support groups lease breast pumps on a monthly basis. A lactation consultant can teach you how to hand express breast milk, if you prefer not to use a pump.  CARING FOR YOUR BREASTS WHILE YOU BREASTFEED Nipples can become dry, cracked, and sore while breastfeeding. The following recommendations can help keep your breasts moisturized and healthy:  Avoid using soap on your nipples.   Wear a supportive bra. Although not required, special nursing bras and tank tops are designed to allow access to your breasts for breastfeeding without taking off your entire bra or top. Avoid wearing underwire-style bras or extremely tight bras.  Air dry your nipples for 3-4minutes after each feeding.   Use only cotton bra pads to absorb leaked breast milk. Leaking of breast milk between feedings is normal.   Use lanolin on your nipples after breastfeeding. Lanolin helps to maintain your skin's normal moisture barrier. If you use pure lanolin, you do not need to wash it off before feeding your baby again. Pure lanolin is not toxic to your baby. You may also hand express a few drops of breast milk and gently massage that milk into your nipples and allow the milk to air dry. In the first few weeks after giving birth, some women  experience extremely full   breasts (engorgement). Engorgement can make your breasts feel heavy, warm, and tender to the touch. Engorgement peaks within 3-5 days after you give birth. The following recommendations can help ease engorgement:  Completely empty your breasts while breastfeeding or pumping. You may want to start by applying warm, moist heat (in the shower or with warm water-soaked hand towels) just before feeding or pumping. This increases circulation and helps the milk flow. If your baby does not completely empty your breasts while breastfeeding, pump any extra milk after he or she is finished.  Wear a snug bra (nursing or regular) or tank top for 1-2 days to signal your body to slightly decrease milk production.  Apply ice packs to your breasts, unless this is too uncomfortable for you.  Make sure that your baby is latched on and positioned properly while breastfeeding. If engorgement persists after 48 hours of following these recommendations, contact your health care provider or a lactation consultant. OVERALL HEALTH CARE RECOMMENDATIONS WHILE BREASTFEEDING  Eat healthy foods. Alternate between meals and snacks, eating 3 of each per day. Because what you eat affects your breast milk, some of the foods may make your baby more irritable than usual. Avoid eating these foods if you are sure that they are negatively affecting your baby.  Drink milk, fruit juice, and water to satisfy your thirst (about 10 glasses a day).   Rest often, relax, and continue to take your prenatal vitamins to prevent fatigue, stress, and anemia.  Continue breast self-awareness checks.  Avoid chewing and smoking tobacco.  Avoid alcohol and drug use. Some medicines that may be harmful to your baby can pass through breast milk. It is important to ask your health care provider before taking any medicine, including all over-the-counter and prescription medicine as well as vitamin and herbal supplements. It is  possible to become pregnant while breastfeeding. If birth control is desired, ask your health care provider about options that will be safe for your baby. SEEK MEDICAL CARE IF:   You feel like you want to stop breastfeeding or have become frustrated with breastfeeding.  You have painful breasts or nipples.  Your nipples are cracked or bleeding.  Your breasts are red, tender, or warm.  You have a swollen area on either breast.  You have a fever or chills.  You have nausea or vomiting.  You have drainage other than breast milk from your nipples.  Your breasts do not become full before feedings by the fifth day after you give birth.  You feel sad and depressed.  Your baby is too sleepy to eat well.  Your baby is having trouble sleeping.   Your baby is wetting less than 3 diapers in a 24-hour period.  Your baby has less than 3 stools in a 24-hour period.  Your baby's skin or the white part of his or her eyes becomes yellow.   Your baby is not gaining weight by 5 days of age. SEEK IMMEDIATE MEDICAL CARE IF:   Your baby is overly tired (lethargic) and does not want to wake up and feed.  Your baby develops an unexplained fever. Document Released: 03/28/2005 Document Revised: 04/02/2013 Document Reviewed: 09/19/2012 Exit[employer] Patient Information 2015 [employer]. This information is not intended to replace advice given to you by your health care provider. Make sure you discuss any questions you have with your health care provider.  

## 2013-12-04 NOTE — Progress Notes (Signed)
Pos GBS

## 2013-12-06 ENCOUNTER — Encounter: Payer: Self-pay | Admitting: General Practice

## 2013-12-12 ENCOUNTER — Ambulatory Visit (INDEPENDENT_AMBULATORY_CARE_PROVIDER_SITE_OTHER): Payer: Medicaid Other | Admitting: Obstetrics & Gynecology

## 2013-12-12 VITALS — BP 124/75 | HR 97 | Temp 98.5°F | Wt 209.1 lb

## 2013-12-12 DIAGNOSIS — Z3493 Encounter for supervision of normal pregnancy, unspecified, third trimester: Secondary | ICD-10-CM

## 2013-12-12 DIAGNOSIS — Z3480 Encounter for supervision of other normal pregnancy, unspecified trimester: Secondary | ICD-10-CM

## 2013-12-12 LAB — POCT URINALYSIS DIP (DEVICE)
Bilirubin Urine: NEGATIVE
GLUCOSE, UA: NEGATIVE mg/dL
Ketones, ur: NEGATIVE mg/dL
Nitrite: NEGATIVE
Protein, ur: NEGATIVE mg/dL
Specific Gravity, Urine: 1.015 (ref 1.005–1.030)
UROBILINOGEN UA: 1 mg/dL (ref 0.0–1.0)
pH: 7 (ref 5.0–8.0)

## 2013-12-12 NOTE — Patient Instructions (Signed)

## 2013-12-12 NOTE — Progress Notes (Signed)
Pt feels like she has a yeast infection, reports unusual discharge/vaginal itching.

## 2013-12-12 NOTE — Progress Notes (Signed)
Pt with no problems.  Reviewed labor precautions. F/u in 1 week Pt declined exam today.  Does not feel that she needs tx for any itching

## 2013-12-16 ENCOUNTER — Encounter (HOSPITAL_COMMUNITY): Payer: Self-pay | Admitting: *Deleted

## 2013-12-16 ENCOUNTER — Inpatient Hospital Stay (HOSPITAL_COMMUNITY)
Admission: AD | Admit: 2013-12-16 | Discharge: 2013-12-19 | DRG: 774 | Disposition: A | Discharge status: Home / Self Care | Payer: Medicaid Other | Source: Ambulatory Visit | Attending: Family Medicine | Admitting: Family Medicine

## 2013-12-16 DIAGNOSIS — A5901 Trichomonal vulvovaginitis: Secondary | ICD-10-CM | POA: Diagnosis present

## 2013-12-16 DIAGNOSIS — Z8249 Family history of ischemic heart disease and other diseases of the circulatory system: Secondary | ICD-10-CM | POA: Diagnosis not present

## 2013-12-16 DIAGNOSIS — Z22330 Carrier of Group B streptococcus: Secondary | ICD-10-CM

## 2013-12-16 DIAGNOSIS — O9882 Other maternal infectious and parasitic diseases complicating childbirth: Secondary | ICD-10-CM | POA: Diagnosis present

## 2013-12-16 DIAGNOSIS — O99892 Other specified diseases and conditions complicating childbirth: Secondary | ICD-10-CM | POA: Diagnosis present

## 2013-12-16 DIAGNOSIS — O133 Gestational [pregnancy-induced] hypertension without significant proteinuria, third trimester: Secondary | ICD-10-CM

## 2013-12-16 DIAGNOSIS — Z789 Other specified health status: Secondary | ICD-10-CM

## 2013-12-16 DIAGNOSIS — O139 Gestational [pregnancy-induced] hypertension without significant proteinuria, unspecified trimester: Secondary | ICD-10-CM | POA: Diagnosis present

## 2013-12-16 DIAGNOSIS — Z87891 Personal history of nicotine dependence: Secondary | ICD-10-CM

## 2013-12-16 DIAGNOSIS — Z3491 Encounter for supervision of normal pregnancy, unspecified, first trimester: Secondary | ICD-10-CM

## 2013-12-16 DIAGNOSIS — O479 False labor, unspecified: Secondary | ICD-10-CM | POA: Diagnosis present

## 2013-12-16 DIAGNOSIS — Z833 Family history of diabetes mellitus: Secondary | ICD-10-CM

## 2013-12-16 DIAGNOSIS — O9989 Other specified diseases and conditions complicating pregnancy, childbirth and the puerperium: Secondary | ICD-10-CM

## 2013-12-16 LAB — CBC
HCT: 33.5 % — ABNORMAL LOW (ref 36.0–46.0)
HEMOGLOBIN: 11 g/dL — AB (ref 12.0–15.0)
MCH: 28.1 pg (ref 26.0–34.0)
MCHC: 32.8 g/dL (ref 30.0–36.0)
MCV: 85.7 fL (ref 78.0–100.0)
Platelets: 342 10*3/uL (ref 150–400)
RBC: 3.91 MIL/uL (ref 3.87–5.11)
RDW: 13.9 % (ref 11.5–15.5)
WBC: 14 10*3/uL — ABNORMAL HIGH (ref 4.0–10.5)

## 2013-12-16 LAB — URINE MICROSCOPIC-ADD ON

## 2013-12-16 LAB — URINALYSIS, ROUTINE W REFLEX MICROSCOPIC
Bilirubin Urine: NEGATIVE
Glucose, UA: NEGATIVE mg/dL
HGB URINE DIPSTICK: NEGATIVE
Ketones, ur: 15 mg/dL — AB
Nitrite: NEGATIVE
PH: 6.5 (ref 5.0–8.0)
Protein, ur: NEGATIVE mg/dL
Specific Gravity, Urine: 1.02 (ref 1.005–1.030)
Urobilinogen, UA: 1 mg/dL (ref 0.0–1.0)

## 2013-12-16 LAB — COMPREHENSIVE METABOLIC PANEL
ALK PHOS: 171 U/L — AB (ref 39–117)
ALT: 10 U/L (ref 0–35)
ANION GAP: 14 (ref 5–15)
AST: 12 U/L (ref 0–37)
Albumin: 3 g/dL — ABNORMAL LOW (ref 3.5–5.2)
BILIRUBIN TOTAL: 0.9 mg/dL (ref 0.3–1.2)
BUN: 9 mg/dL (ref 6–23)
CO2: 21 meq/L (ref 19–32)
Calcium: 9.3 mg/dL (ref 8.4–10.5)
Chloride: 103 mEq/L (ref 96–112)
Creatinine, Ser: 0.76 mg/dL (ref 0.50–1.10)
GFR calc Af Amer: >90 mL/min (ref >90)
GLUCOSE: 80 mg/dL (ref 70–99)
POTASSIUM: 4.3 meq/L (ref 3.7–5.3)
Sodium: 138 mEq/L (ref 137–147)
Total Protein: 7 g/dL (ref 6.0–8.3)

## 2013-12-16 LAB — PROTEIN / CREATININE RATIO, URINE
Creatinine, Urine: 145.76 mg/dL
Protein Creatinine Ratio: 0.13 (ref 0.00–0.15)
Total Protein, Urine: 19.5 mg/dL

## 2013-12-16 LAB — WET PREP, GENITAL
Clue Cells Wet Prep HPF POC: NONE SEEN
Yeast Wet Prep HPF POC: NONE SEEN

## 2013-12-16 LAB — POCT FERN TEST: POCT Fern Test: NEGATIVE

## 2013-12-16 MED ORDER — LACTATED RINGERS IV SOLN
INTRAVENOUS | Status: DC
  Administered 2013-12-16 – 2013-12-17 (×2): via INTRAVENOUS
  Administered 2013-12-17: 125 mL/h via INTRAVENOUS

## 2013-12-16 MED ORDER — TERBUTALINE SULFATE 1 MG/ML IJ SOLN
0.2500 mg | Freq: Once | INTRAMUSCULAR | Status: AC | PRN
Start: 2013-12-16 — End: 2013-12-16

## 2013-12-16 MED ORDER — FENTANYL CITRATE 0.05 MG/ML IJ SOLN
100.0000 ug | INTRAMUSCULAR | Status: DC | PRN
  Administered 2013-12-17: 100 ug via INTRAVENOUS
  Filled 2013-12-16: qty 2

## 2013-12-16 MED ORDER — CITRIC ACID-SODIUM CITRATE 334-500 MG/5ML PO SOLN: 30.0000 mL | ORAL | Status: DC | PRN

## 2013-12-16 MED ORDER — ZOLPIDEM TARTRATE 5 MG PO TABS
5.0000 mg | ORAL_TABLET | Freq: Every evening | ORAL | Status: DC | PRN
Start: 2013-12-16 — End: 2013-12-17

## 2013-12-16 MED ORDER — PENICILLIN G POTASSIUM 5000000 UNITS IJ SOLR
2.5000 10*6.[IU] | INTRAMUSCULAR | Status: DC
  Administered 2013-12-17 (×4): 2.5 10*6.[IU] via INTRAVENOUS
  Filled 2013-12-16 (×7): qty 2.5

## 2013-12-16 MED ORDER — ACETAMINOPHEN 325 MG PO TABS
650.0000 mg | ORAL_TABLET | ORAL | Status: DC | PRN
  Administered 2013-12-17: 650 mg via ORAL
  Filled 2013-12-16: qty 2

## 2013-12-16 MED ORDER — OXYCODONE-ACETAMINOPHEN 5-325 MG PO TABS
1.0000 | ORAL_TABLET | ORAL | Status: DC | PRN
Start: 2013-12-16 — End: 2013-12-17

## 2013-12-16 MED ORDER — OXYTOCIN 40 UNITS IN LACTATED RINGERS INFUSION - SIMPLE MED
62.5000 mL/h | INTRAVENOUS | Status: DC
  Administered 2013-12-17: 62.5 mL/h via INTRAVENOUS

## 2013-12-16 MED ORDER — LACTATED RINGERS IV SOLN
500.0000 mL | INTRAVENOUS | Status: DC | PRN
  Administered 2013-12-17: 500 mL via INTRAVENOUS

## 2013-12-16 MED ORDER — OXYTOCIN 40 UNITS IN LACTATED RINGERS INFUSION - SIMPLE MED
1.0000 m[IU]/min | INTRAVENOUS | Status: DC
  Administered 2013-12-16: 2 m[IU]/min via INTRAVENOUS
  Filled 2013-12-16: qty 1000

## 2013-12-16 MED ORDER — PENICILLIN G POTASSIUM 5000000 UNITS IJ SOLR
5.0000 10*6.[IU] | Freq: Once | INTRAVENOUS | Status: AC
  Administered 2013-12-16: 5 10*6.[IU] via INTRAVENOUS
  Filled 2013-12-16: qty 5

## 2013-12-16 MED ORDER — ONDANSETRON HCL 4 MG/2ML IJ SOLN
4.0000 mg | Freq: Four times a day (QID) | INTRAMUSCULAR | Status: DC | PRN
  Administered 2013-12-17: 4 mg via INTRAVENOUS
  Filled 2013-12-16: qty 2

## 2013-12-16 MED ORDER — METRONIDAZOLE 500 MG PO TABS
2000.0000 mg | ORAL_TABLET | Freq: Once | ORAL | Status: AC
  Administered 2013-12-16: 2000 mg via ORAL
  Filled 2013-12-16: qty 4

## 2013-12-16 MED ORDER — OXYCODONE-ACETAMINOPHEN 5-325 MG PO TABS: 2.0000 | ORAL_TABLET | ORAL | Status: DC | PRN

## 2013-12-16 MED ORDER — OXYTOCIN BOLUS FROM INFUSION
500.0000 mL | INTRAVENOUS | Status: DC
  Administered 2013-12-17: 500 mL via INTRAVENOUS

## 2013-12-16 MED ORDER — LIDOCAINE HCL (PF) 1 % IJ SOLN
30.0000 mL | INTRAMUSCULAR | Status: DC | PRN
  Filled 2013-12-16: qty 30

## 2013-12-16 NOTE — MAU Provider Note (Signed)
None     Chief Complaint:  Contractions and Vaginal Discharge   [name] is  28 y.o. G1P0 at [redacted]w[redacted]d presents complaining of Contractions and Vaginal Discharge She states that she has been having  Contractions all afternoon, now 4-5 minutes apart.  Has been having a mucousy vaginal discharge for 2 days.  Denies bleeding, HA, RUQ pain. Last cx exam was 2 weeks ago.   Obstetrical/Gynecological History: OB History   Grav Para Term Preterm Abortions TAB SAB Ect Mult Living   1              Past Medical History: History reviewed. No pertinent past medical history.  Past Surgical History: Past Surgical History  Procedure Laterality Date  . Knee surgery Left 2001  . Wisdom tooth extraction      Family History: Family History  Problem Relation Age of Onset  . Hypertension Mother   . Diabetes Maternal Grandfather     Social History: History  Substance Use Topics  . Smoking status: Former Games developer  . Smokeless tobacco: Never Used  . Alcohol Use: No    Allergies:  Allergies  Allergen Reactions  . Shellfish Allergy Anaphylaxis    Meds:  Prescriptions prior to admission  Medication Sig Dispense Refill  . Multiple Vitamins-Minerals (MULTIVITAMIN GUMMIES ADULTS) CHEW Chew 1 tablet by mouth daily.        Review of Systems   Constitutional: Negative for fever and chills Eyes: Negative for visual disturbances Respiratory: Negative for shortness of breath, dyspnea Cardiovascular: Negative for chest pain or palpitations  Gastrointestinal: Negative for vomiting, diarrhea and constipation Genitourinary: Negative for dysuria and urgency Musculoskeletal: Negative for back pain, joint pain, myalgias  Neurological: Negative for dizziness and headaches     Physical Exam  Blood pressure 136/90, pulse 88, temperature 98 F (36.7 C), temperature source Oral, resp. rate 18, height  (1.651 m), weight 94.802 kg (209 lb), last menstrual period 03/12/2013, SpO2  100.00%. GENERAL: Well-developed, well-nourished female in no acute distress.  LUNGS: Clear to auscultation bilaterally.  HEART: Regular rate and rhythm. ABDOMEN: Soft, nontender, nondistended, gravid.  EXTREMITIES: Nontender, no edema, 2+ distal pulses. DTR's 2+ PELVIC:  SSE:  White frothy discharge with some scant blood show.  No pooling.  Negative fern and valsalva.  Wet prep sent CERVICAL EXAM: Dilatation 3cm   Effacement 90%   Station -2 by RN   Presentation: cephalic FHT:  Baseline rate 140 bpm   Variability moderate  Accelerations present   Decelerations none Contractions: Every 4-5 mins, mild   Labs: No results found for this or any previous visit (from the past 24 hour(s)). Imaging Studies:  No results found.  Assessment: [name] is  28 y.o. G1P0 at [redacted]w[redacted]d presents with possible early vs false labor No evidence of ROM Possible GHTN.  Plan:  Monitor BP and cervix Preeclampsia labs Care turned over to K. Clelia Croft, CNM   CRESENZO-DISHMAN,Nikolus Marczak 9/7/20158:32 PM

## 2013-12-16 NOTE — H&P (Signed)
Chief Complaint: Contractions and Vaginal Discharge   [name] is 28 y.o. G1P0 at [redacted]w[redacted]d by LMP and confirmed by 9wk U/S presents complaining of Contractions and Vaginal Discharge  She states that she has been having Contractions all afternoon, now 4-5 minutes apart. Has been having a mucousy vaginal discharge for 2 days. Denies bleeding, HA, RUQ pain. Last cx exam was 2 weeks ago. Her preg has been followed by the [facility] and has been remarkable for 1) rubella nonimmune 2) +THC in early preg 3) +chlam in first tri 4) +GBS.   Obstetrical/Gynecological History:  OB History    Grav  Para  Term  Preterm  Abortions  TAB  SAB  Ect  Mult  Living    1               Past Medical History:  History reviewed. No pertinent past medical history.  Past Surgical History:  Past Surgical History   Procedure  Laterality  Date   .  Knee surgery  Left  2001   .  Wisdom tooth extraction      Family History:  Family History   Problem  Relation  Age of Onset   .  Hypertension  Mother    .  Diabetes  Maternal Grandfather     Social History:  History   Substance Use Topics   .  Smoking status:  Former Games developer   .  Smokeless tobacco:  Never Used   .  Alcohol Use:  No    Allergies:  Allergies   Allergen  Reactions   .  Shellfish Allergy  Anaphylaxis    Meds:  Prescriptions prior to admission   Medication  Sig  Dispense  Refill   .  Multiple Vitamins-Minerals (MULTIVITAMIN GUMMIES ADULTS) CHEW  Chew 1 tablet by mouth daily.      Review of Systems  Constitutional: Negative for fever and chills  Eyes: Negative for visual disturbances  Respiratory: Negative for shortness of breath, dyspnea  Cardiovascular: Negative for chest pain or palpitations  Gastrointestinal: Negative for vomiting, diarrhea and constipation  Genitourinary: Negative for dysuria and urgency  Musculoskeletal: Negative for back pain, joint pain, myalgias  Neurological: Negative for dizziness and headaches  Physical Exam    Blood pressure 136/90, pulse 88, temperature 98 F (36.7 C), temperature source Oral, resp. rate 18, height  (1.651 m), weight 94.802 kg (209 lb), last menstrual period 03/12/2013, SpO2 100.00%.  GENERAL: Well-developed, well-nourished female in no acute distress.  LUNGS: Clear to auscultation bilaterally.  HEART: Regular rate and rhythm.  ABDOMEN: Soft, nontender, nondistended, gravid.  EXTREMITIES: Nontender, no edema, 2+ distal pulses.  DTR's 2+  PELVIC: SSE: White frothy discharge with some scant blood show. No pooling. Negative fern and valsalva. Wet prep sent  CERVICAL EXAM: Dilatation 3cm Effacement 90% Station -2 by RN  Presentation: cephalic  FHT: Baseline rate 140 bpm Variability moderate Accelerations present Decelerations none  Contractions: Every 4-5 mins, mild  Labs:  No results found for this or any previous visit (from the past 24 hour(s)).  Imaging Studies:  No results found.  Assessment:  [name] is 28 y.o. G1P0 at [redacted]w[redacted]d presents with possible early vs false labor  No evidence of ROM  Possible GHTN.  Plan:  Monitor BP and cervix  Preeclampsia labs  Care turned over to K. Clelia Croft, CNM   CRESENZO-DISHMAN,FRANCES  9/7/20158:32 PM  Pt feeling well; ctx still mild BPs 131-142/81-94 FHR 140s, +accels, occ mi variables Ctx mild  and irreg, q 3-6 min Cx: unchanged (3/90/-2) Wet prep: few trich (also seen on UA micro)  CBC    Component Value Date/Time   WBC 14.0* 12/16/2013 2021   RBC 3.91 12/16/2013 2021   HGB 11.0* 12/16/2013 2021   HCT 33.5* 12/16/2013 2021   PLT 342 12/16/2013 2021   MCV 85.7 12/16/2013 2021   MCH 28.1 12/16/2013 2021   MCHC 32.8 12/16/2013 2021   RDW 13.9 12/16/2013 2021   LYMPHSABS 2.5 05/16/2013 1543   MONOABS 1.2* 05/16/2013 1543   EOSABS 0.0 05/16/2013 1543   BASOSABS 0.0 05/16/2013 1543   CMP     Component Value Date/Time   NA 138 12/16/2013 2021   K 4.3 12/16/2013 2021   CL 103 12/16/2013 2021   CO2 21 12/16/2013 2021   GLUCOSE 80 12/16/2013 2021    BUN 9 12/16/2013 2021   CREATININE 0.76 12/16/2013 2021   CALCIUM 9.3 12/16/2013 2021   PROT 7.0 12/16/2013 2021   ALBUMIN 3.0* 12/16/2013 2021   AST 12 12/16/2013 2021   ALT 10 12/16/2013 2021   ALKPHOS 171* 12/16/2013 2021   BILITOT 0.9 12/16/2013 2021   GFRNONAA >90 12/16/2013 2021   GFRAA >90 12/16/2013 2021   Protein creatine ratio: 0.13  IUP@39 .[redacted]wks Gestational HTN +Trich GBS +  Will admit to [facility] due to elevated BPs and favorable cx Flagyl 2gm x 1 Plan Pitocin after PCN administered Anticipate SVD Trich dx discussed with pt- understandably upset; says has not had IC since she became preg. She plans to make her last partner aware. UDS pending  Cam Hai CNM 12/16/2013 9:57 PM

## 2013-12-16 NOTE — MAU Note (Signed)
Pt states that she has been having UC's since yesterday.States positive leaking of fluid for 2 days.Denies large gush of fluids. Positive fetal movement. Denies vaginal bleeding. Denies HSV. Denies MRSA. Denies n/v/d currently.

## 2013-12-17 ENCOUNTER — Encounter: Payer: Self-pay | Admitting: Obstetrics & Gynecology

## 2013-12-17 ENCOUNTER — Encounter (HOSPITAL_COMMUNITY): Payer: Medicaid Other | Admitting: Anesthesiology

## 2013-12-17 ENCOUNTER — Encounter (HOSPITAL_COMMUNITY): Payer: Self-pay | Admitting: *Deleted

## 2013-12-17 ENCOUNTER — Inpatient Hospital Stay (HOSPITAL_COMMUNITY): Payer: Medicaid Other | Admitting: Anesthesiology

## 2013-12-17 LAB — CBC
HCT: 32.3 % — ABNORMAL LOW (ref 36.0–46.0)
Hemoglobin: 10.8 g/dL — ABNORMAL LOW (ref 12.0–15.0)
MCH: 28.4 pg (ref 26.0–34.0)
MCHC: 33.4 g/dL (ref 30.0–36.0)
MCV: 85 fL (ref 78.0–100.0)
Platelets: 280 10*3/uL (ref 150–400)
RBC: 3.8 MIL/uL — AB (ref 3.87–5.11)
RDW: 13.8 % (ref 11.5–15.5)
WBC: 15.6 10*3/uL — AB (ref 4.0–10.5)

## 2013-12-17 LAB — RAPID URINE DRUG SCREEN, HOSP PERFORMED
Amphetamines: NOT DETECTED
BENZODIAZEPINES: NOT DETECTED
Barbiturates: NOT DETECTED
COCAINE: NOT DETECTED
Opiates: NOT DETECTED
TETRAHYDROCANNABINOL: NOT DETECTED

## 2013-12-17 LAB — RPR

## 2013-12-17 MED ORDER — EPHEDRINE 5 MG/ML INJ
10.0000 mg | INTRAVENOUS | Status: DC | PRN
  Filled 2013-12-17: qty 2

## 2013-12-17 MED ORDER — TETANUS-DIPHTH-ACELL PERTUSSIS 5-2.5-18.5 LF-MCG/0.5 IM SUSP: 0.5000 mL | Freq: Once | INTRAMUSCULAR | Status: DC

## 2013-12-17 MED ORDER — PHENYLEPHRINE 40 MCG/ML (10ML) SYRINGE FOR IV PUSH (FOR BLOOD PRESSURE SUPPORT)
80.0000 ug | PREFILLED_SYRINGE | INTRAVENOUS | Status: DC | PRN
  Filled 2013-12-17: qty 2

## 2013-12-17 MED ORDER — DIBUCAINE 1 % RE OINT: 1.0000 "application " | TOPICAL_OINTMENT | RECTAL | Status: DC | PRN

## 2013-12-17 MED ORDER — WITCH HAZEL-GLYCERIN EX PADS: 1.0000 "application " | MEDICATED_PAD | CUTANEOUS | Status: DC | PRN

## 2013-12-17 MED ORDER — ZOLPIDEM TARTRATE 5 MG PO TABS
5.0000 mg | ORAL_TABLET | Freq: Every evening | ORAL | Status: DC | PRN
Start: 2013-12-17 — End: 2013-12-19

## 2013-12-17 MED ORDER — BENZOCAINE-MENTHOL 20-0.5 % EX AERO
1.0000 "application " | INHALATION_SPRAY | CUTANEOUS | Status: DC | PRN
  Administered 2013-12-17: 1 via TOPICAL
  Filled 2013-12-17: qty 56

## 2013-12-17 MED ORDER — OXYCODONE-ACETAMINOPHEN 5-325 MG PO TABS
1.0000 | ORAL_TABLET | ORAL | Status: DC | PRN
  Administered 2013-12-17 – 2013-12-18 (×2): 1 via ORAL
  Filled 2013-12-17 (×2): qty 1

## 2013-12-17 MED ORDER — LANOLIN HYDROUS EX OINT: TOPICAL_OINTMENT | CUTANEOUS | Status: DC | PRN

## 2013-12-17 MED ORDER — ONDANSETRON HCL 4 MG PO TABS
4.0000 mg | ORAL_TABLET | ORAL | Status: DC | PRN
Start: 2013-12-17 — End: 2013-12-19

## 2013-12-17 MED ORDER — SENNOSIDES-DOCUSATE SODIUM 8.6-50 MG PO TABS
2.0000 | ORAL_TABLET | ORAL | Status: DC
  Administered 2013-12-17 – 2013-12-18 (×2): 2 via ORAL
  Filled 2013-12-17 (×2): qty 2

## 2013-12-17 MED ORDER — DIPHENHYDRAMINE HCL 25 MG PO CAPS: 25.0000 mg | ORAL_CAPSULE | Freq: Four times a day (QID) | ORAL | Status: DC | PRN

## 2013-12-17 MED ORDER — LIDOCAINE HCL (PF) 1 % IJ SOLN
INTRAMUSCULAR | Status: DC | PRN
  Administered 2013-12-17: 10 mL

## 2013-12-17 MED ORDER — IBUPROFEN 600 MG PO TABS
600.0000 mg | ORAL_TABLET | Freq: Four times a day (QID) | ORAL | Status: DC
  Administered 2013-12-17 – 2013-12-19 (×6): 600 mg via ORAL
  Filled 2013-12-17 (×6): qty 1

## 2013-12-17 MED ORDER — PHENYLEPHRINE 40 MCG/ML (10ML) SYRINGE FOR IV PUSH (FOR BLOOD PRESSURE SUPPORT)
80.0000 ug | PREFILLED_SYRINGE | INTRAVENOUS | Status: DC | PRN
  Filled 2013-12-17: qty 2
  Filled 2013-12-17: qty 10

## 2013-12-17 MED ORDER — FENTANYL 2.5 MCG/ML BUPIVACAINE 1/10 % EPIDURAL INFUSION (WH - ANES)
14.0000 mL/h | INTRAMUSCULAR | Status: DC | PRN
  Administered 2013-12-17 (×2): 14 mL/h via EPIDURAL
  Filled 2013-12-17 (×2): qty 125

## 2013-12-17 MED ORDER — FENTANYL 2.5 MCG/ML BUPIVACAINE 1/10 % EPIDURAL INFUSION (WH - ANES): 14.0000 mL/h | INTRAMUSCULAR | Status: DC | PRN

## 2013-12-17 MED ORDER — LACTATED RINGERS IV SOLN
500.0000 mL | Freq: Once | INTRAVENOUS | Status: AC
  Administered 2013-12-17: 500 mL via INTRAVENOUS

## 2013-12-17 MED ORDER — OXYCODONE-ACETAMINOPHEN 5-325 MG PO TABS: 2.0000 | ORAL_TABLET | ORAL | Status: DC | PRN

## 2013-12-17 MED ORDER — DIPHENHYDRAMINE HCL 50 MG/ML IJ SOLN
12.5000 mg | INTRAMUSCULAR | Status: DC | PRN
  Administered 2013-12-17: 12.5 mg via INTRAVENOUS
  Filled 2013-12-17: qty 1

## 2013-12-17 MED ORDER — SIMETHICONE 80 MG PO CHEW
80.0000 mg | CHEWABLE_TABLET | ORAL | Status: DC | PRN
Start: 2013-12-17 — End: 2013-12-19

## 2013-12-17 MED ORDER — ONDANSETRON HCL 4 MG/2ML IJ SOLN: 4.0000 mg | INTRAMUSCULAR | Status: DC | PRN

## 2013-12-17 MED ORDER — PRENATAL MULTIVITAMIN CH
1.0000 | ORAL_TABLET | Freq: Every day | ORAL | Status: DC
  Administered 2013-12-18: 1 via ORAL
  Filled 2013-12-17: qty 1

## 2013-12-17 NOTE — MAU Provider Note (Signed)
Attestation of Attending Supervision of Advanced Practitioner (PA/CNM/NP): Evaluation and management procedures were performed by the Advanced Practitioner under my supervision and collaboration.  I have reviewed the Advanced Practitioner's note and chart, and I agree with the management and plan.  Reva Bores, MD [facility] Attending 12/17/2013 7:00 AM

## 2013-12-17 NOTE — Progress Notes (Signed)
[name] is a 28 y.o. G1P0 at [redacted]w[redacted]d.  Subjective: Mild tightening w/ UC's since getting epidural.   Objective: BP 117/86  Pulse 128  Temp(Src) 98.2 F (36.8 C) (Oral)  Resp 20  Ht  (1.651 m)  Wt 94.802 kg (209 lb)  BMI 34.78 kg/m2  SpO2 100%  LMP 03/12/2013      FHT:  FHR: 130-140 bpm, variability: minimal-mod ,  accelerations:  Present,  decelerations:  Present few variables UC:   regular, every 2-3 minutes SVE:   Dilation: 6 Effacement (%): 100 Station: 0 Exam by:: Ivonne Andrew, CNM  Labs: Lab Results  Component Value Date   WBC 14.0* 12/16/2013   HGB 11.0* 12/16/2013   HCT 33.5* 12/16/2013   MCV 85.7 12/16/2013   PLT 342 12/16/2013    Assessment / Plan: Spontaneous labor, progressing normally  Labor: Progressing normally Preeclampsia:  NA Fetal Wellbeing:  Category I and Category II Pain Control:  Epidural I/D:  n/a Anticipated MOD:  NSVD  Jedaiah Rathbun 12/17/2013, 1:49 PM

## 2013-12-17 NOTE — H&P (Signed)
Attestation of Attending Supervision of Advanced Practitioner (PA/CNM/NP): Evaluation and management procedures were performed by the Advanced Practitioner under my supervision and collaboration.  I have reviewed the Advanced Practitioner's note and chart, and I agree with the management and plan.  Bailey Kolbe S, MD [facility] Attending 12/17/2013 7:00 AM   

## 2013-12-17 NOTE — Anesthesia Preprocedure Evaluation (Signed)
Anesthesia Evaluation  Patient identified by MRN, date of birth, ID band Patient awake    Reviewed: Allergy & Precautions, H&P , Patient's Chart, lab work & pertinent test results  Airway Mallampati: II TM Distance: >3 FB Neck ROM: full    Dental  (+) Teeth Intact   Pulmonary former smoker,  breath sounds clear to auscultation        Cardiovascular hypertension, Rhythm:regular Rate:Normal     Neuro/Psych    GI/Hepatic   Endo/Other    Renal/GU      Musculoskeletal   Abdominal   Peds  Hematology   Anesthesia Other Findings       Reproductive/Obstetrics (+) Pregnancy                           Anesthesia Physical Anesthesia Plan  ASA: II  Anesthesia Plan: Epidural   Post-op Pain Management:    Induction:   Airway Management Planned:   Additional Equipment:   Intra-op Plan:   Post-operative Plan:   Informed Consent: I have reviewed the patients History and Physical, chart, labs and discussed the procedure including the risks, benefits and alternatives for the proposed anesthesia with the patient or authorized representative who has indicated his/her understanding and acceptance.   Dental Advisory Given  Plan Discussed with:   Anesthesia Plan Comments: (Labs checked- platelets confirmed with RN in room. Fetal heart tracing, per RN, reported to be stable enough for sitting procedure. Discussed epidural, and patient consents to the procedure:  included risk of possible headache,backache, failed block, allergic reaction, and nerve injury. This patient was asked if she had any questions or concerns before the procedure started.)        Anesthesia Quick Evaluation  

## 2013-12-17 NOTE — Progress Notes (Signed)
   [name] is a 28 y.o. G1P0 at [redacted]w[redacted]d  admitted for contractions and elevated BPs  Subjective: Doing well with an epidural  Objective: Filed Vitals:   12/17/13 0731 12/17/13 0801 12/17/13 0831 12/17/13 0901  BP: 119/76 120/82 128/93 125/84  Pulse: 69 79 80 82  Temp: 98.2 F (36.8 C)     TempSrc: Oral     Resp: Height:      Weight:      SpO2:          FHT:  130, mod var, + accels, variable decels improved with position changes UC:   regular, every 3-5 minutes SVE:   Dilation: 5 Effacement (%): 100 Station: -2 Exam by:: JDaley   Labs: Lab Results  Component Value Date   WBC 14.0* 12/16/2013   HGB 11.0* 12/16/2013   HCT 33.5* 12/16/2013   MCV 85.7 12/16/2013   PLT 342 12/16/2013    Assessment / Plan: 28 y/o with slow progression of labor  Labor: Progressing slowly, may consider AROM Fetal Wellbeing:  Category I Pain Control:  Epidural Anticipated MOD:  NSVD BPs: Stable with 1 BP elevated at 128/93 GBS: continue PCN  Rodrigo Ran S 12/17/2013, 9:32 AM

## 2013-12-17 NOTE — Anesthesia Procedure Notes (Signed)
Epidural Patient location during procedure: OB  Preanesthetic Checklist Completed: patient identified, site marked, surgical consent, pre-op evaluation, timeout performed, IV checked, risks and benefits discussed and monitors and equipment checked  Epidural Patient position: sitting Prep: site prepped and draped and DuraPrep Patient monitoring: continuous pulse ox and blood pressure Approach: midline Injection technique: LOR air  Needle:  Needle type: Tuohy  Needle gauge: 17 G Needle length: 9 cm and 9 Needle insertion depth: 7 cm Catheter type: closed end flexible Catheter size: 19 Gauge Catheter at skin depth: 14 cm Test dose: negative  Assessment Events: blood not aspirated, injection not painful, no injection resistance, negative IV test and no paresthesia  Additional Notes Dosing of Epidural:  1st dose, through catheter .............................................  Xylocaine 40 mg  2nd dose, through catheter, after waiting 3 minutes.........Xylocaine 60 mg    ( 1% Xylo charted as a single dose in Epic Meds for ease of charting; actual dosing was fractionated as above, for saftey's sake)  As each dose occurred, patient was free of IV sx; and patient exhibited no evidence of SA injection.  Patient is more comfortable after epidural dosed. Please see RN's note for documentation of vital signs,and FHR which are stable.  Patient reminded not to try to ambulate with numb legs, and that an RN must be present when she attempts to get up.       

## 2013-12-18 ENCOUNTER — Encounter (HOSPITAL_COMMUNITY): Payer: Self-pay

## 2013-12-18 NOTE — Anesthesia Postprocedure Evaluation (Signed)
Anesthesia Post Note  Patient: [name]  Procedure(s) Performed: * No procedures listed *  Anesthesia type: Epidural  Patient location: Mother/Baby  Post pain: Pain level controlled  Post assessment: Post-op Vital signs reviewed  Last Vitals:  Filed Vitals:   12/18/13 0538  BP: 128/79  Pulse: 85  Temp: 36.8 C  Resp: 18    Post vital signs: Reviewed  Level of consciousness: awake  Complications: No apparent anesthesia complications

## 2013-12-18 NOTE — Progress Notes (Signed)
UR chart review completed.  

## 2013-12-18 NOTE — Progress Notes (Signed)
Post Partum Day 1 Subjective: no complaints, up ad lib, voiding and tolerating PO  Objective: Blood pressure 128/79, pulse 85, temperature 98.2 F (36.8 C), temperature source Oral, resp. rate 18, height  (1.651 m), weight 94.802 kg (209 lb), last menstrual period 03/12/2013, SpO2 100.00%, unknown if currently breastfeeding.  Patient attempting to breast feed but difficulties with latch.  Physical Exam:  General: alert, cooperative and no distress Lochia: appropriate Uterine Fundus: firm DVT Evaluation: No evidence of DVT seen on physical exam. Negative Homan's sign. No cords or calf tenderness. No significant calf/ankle edema.   Recent Labs  12/16/13 2021 12/17/13 1730  HGB 11.0* 10.8*  HCT 33.5* 32.3*    Assessment/Plan: Plan for discharge tomorrow, Breastfeeding, Lactation consult and Contraception Depo Provera Currently bottle feeding due to latch   LOS: 2 days   Joanna Puff 12/18/2013, 7:06 AM   OB fellow attestation Post Partum Day 1 I have seen and examined this patient and agree with above documentation in the resident's note.   Njeri Vicente is a 28 y.o. G1P1001 s/p NSVD.  Pt denies problems with ambulating, voiding or po intake. Pain is well controlled.  Plan for birth control is Depo-Provera.  Method of Feeding: breast  PE:  BP 128/79  Pulse 85  Temp(Src) 98.2 F (36.8 C) (Oral)  Resp 18  Ht  (1.651 m)  Wt 209 lb (94.802 kg)  BMI 34.78 kg/m2  SpO2 100%  LMP 03/12/2013  Breastfeeding? Unknown Fundus firm  Plan for discharge: PPD2  William Dalton, MD 8:01 AM

## 2013-12-18 NOTE — Progress Notes (Signed)
Clinical Social Work Department PSYCHOSOCIAL ASSESSMENT - MATERNAL/CHILD 12/18/2013  Patient:  [name], [name]  Account Number:  [id number]  Admit Date:  12/16/2013  Ardine Eng Name:   Aaron Edelman   Clinical Social Worker:  Lucita Ferrara, CLINICAL SOCIAL WORKER   Date/Time:  12/18/2013 01:45 PM  Date Referred:  12/17/2013   Referral source  Central Nursery     Referred reason  Substance Abuse   Other referral source:    I:  FAMILY / HOME ENVIRONMENT Child's legal guardian:  PARENT  Guardian - Name Guardian - Age Guardian - Address  [name] [address] [place], [place] [address]  Whitman Hero  same as above   Other household support members/support persons Other support:   MOB shared that the FOB's family lives nearby.  She stated that her family lives out of town, but endorsed strong relationship with them.  Per MOB, her parents are coming to stay with her for the first couple of weeks as a new mother. MOB also endorsed strong relationship with her cousin that lives 5 minutes away from her.    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  She shared that she will have 8 weeks off before needing to return to work. She shared that the FOB is also employed.   Financial resources:  Medicaid If Medicaid - County:  [place] / Grade:  N/A Music therapist / Child Services Coordination / Early Interventions:   None reported  Cultural issues impacting care:   None reported.    III  STRENGTHS Strengths  Adequate Resources  Home prepared for Child (including basic supplies)  Supportive family/friends   Strength comment:    IV  RISK FACTORS AND CURRENT PROBLEMS Current Problem:  YES   Risk Factor & Current Problem Patient Issue Family Issue Risk Factor / Current Problem Comment   Substance Abuse Y N MOB presents with history of THC use early in her pregnancy.  She stated that she smoked THC until she learned that she was pregnant.  Baby's UDS is negative, and the meconium drug screen is pending.     V  SOCIAL WORK ASSESSMENT CSW met with MOB in her room in order to complete the assessment. Consult ordered due to MOB presenting with a history of THC use early in her pregnancy.  MOB was easily engaged in the assessment and was receptive to exploring her thoughts and feelings as she becomes a first time mother.  MOB presented with appropriate mood and range in affect, and was receptive to discussing her prior THC use. MOB thanked CSW for intervention and agreed to contact CSW if she has additional questions or concerns.   CSW explored with MOB how she is adjusting to becoming a mother for the first time.  MOB discussed that she was very overwhelmed when she learned that she was pregnant since it was unplanned and since her relationship with the FOB was new.  She shared that she became excited once she stepped back from the situations since she wanted to have a child.  MOB also shared that she became more excited because the FOB was excited as well.  MOB smiled as she discussed her feelings as she experiences this life transition, but also did express some anxiety since she has had limited exposure to  infants.  MOB shared that her cousin had a baby 3 months ago and this has had helped her increase confidence since she is slowly learning how to care for a baby.  CSW validated the anxiety that accompanies this role transition, and encouraged MOB to utilize staff here at [facility] in order to answer her questions about how to care for a newborn.  MOB agreeable.   MOB shared that she feels well supported by the FOB and her family, and also discussed feeling well supported by her employer.  MOB denied any other recent psychosocial stressors or life changes.   MOB denied mental health  history, but does acknowledge symptoms of depression 3-4 years ago at the end of a relationship.  CSW reviewed her symptoms which included isolating and low motivation to spend time with family.  MOB presented with self-awareness that these are maladaptive coping skills and that it actually caused her depression to worsen.  MOB is able to verbalize the importance of reaching out to her support system in the future if she experience symptoms.  MOB receptive to education on postpartum depression, and is expressing some fear that she will experience symptoms since she has already felt overwhelmed when she has been unable to figure out why the baby was crying.  CSW validated her feelings, and explored with MOB how to respond when she begins to feel overwhelmed.  She stated that she has talked to her cousin about it, and she knows that she needs to put the baby down, walk away, calm down, and then return to the crying baby.  CSW praised the [facility] for her awareness of needing to take care of herself in these moments.  CSW continued to assist MOB develop coping skills in these moments, and MOB is aware of deep breathing, calling her mother, and asking for nearby family for support.   CSW inquired about substance use history.  MOB endorsed occasional THC use until she learned that she was pregnant.  She stated that she stopped using as soon as she learned that she was pregnant since "I did not want anything bad to happen to the baby".  MOB denied any other substance use history and denied belief that THC use was problematic.  MOB verbalized understanding of hospital drug screen policy and voiced no concerns since she is confident that meconium drug screen will be negative.   No barriers to discharge.   VI SOCIAL WORK PLAN Social Work Therapist, art  No Further Intervention Required / No Barriers to Discharge   Type of pt/family education:   Postpartum depression and anxiety  Hospital drug screen  policy   If child protective services report - county:   If child protective services report - date:   Information/referral to community resources comment:   Other social work plan:   CSW to provide ongoing emotional support PRN.

## 2013-12-19 MED ORDER — IBUPROFEN 600 MG PO TABS: 600.0000 mg | ORAL_TABLET | Freq: Four times a day (QID) | ORAL | Status: AC | PRN

## 2013-12-19 MED ORDER — MEASLES, MUMPS & RUBELLA VAC ~~LOC~~ INJ
0.5000 mL | INJECTION | Freq: Once | SUBCUTANEOUS | Status: AC
  Administered 2013-12-19: 0.5 mL via SUBCUTANEOUS
  Filled 2013-12-19: qty 0.5

## 2013-12-19 NOTE — Discharge Summary (Signed)
  Obstetric Discharge Summary Reason for Admission: onset of labor Prenatal Procedures: ultrasound Intrapartum Procedures: spontaneous vaginal delivery Postpartum Procedures: none Complications-Operative and Postpartum: 2nd degree perineal laceration  Delivery Note At 4:28 PM a viable female was delivered via Vaginal, Spontaneous Delivery (Presentation: ; Occiput Anterior).  APGAR: 4, 8; weight 7 lb 10.8 oz (3481 g).   Placenta status: Intact, Spontaneous.  Cord: 3 vessels with the following complications: .  Cord pH: n/a  Anesthesia: Epidural  Episiotomy: None Lacerations: 2nd degree;Perineal Suture Repair: 3.0 vicryl rapide Est. Blood Loss (mL): 350  Mom to postpartum.  Baby to Couplet care / Skin to Skin.  [name] [name], [name]     Hospital Course:  Active Problems:   Gestational hypertension without significant proteinuria in third trimester   Today: No acute events overnight.  Pt denies problems with ambulating, voiding or po intake.  She denies nausea or vomiting.  Pain is well controlled.  She has had flatus. Lochia Moderate.  Plan for birth control is  Depo-Provera.  Method of Feeding: breast/bottle  [name] is a 28 y.o. G1P1001 s/p SVD.  Patient presented to OBT with complaints of contractions and marginal elevated BPs with early latent labor and was admitted to L&D.  She has postpartum course that was uncomplicated including no problems with ambulating, PO intake, urination, pain, or bleeding. The pt feels ready to go home and  will be discharged with outpatient follow-up.    H/H: Lab Results  Component Value Date/Time   HGB 10.8* 12/17/2013  5:30 PM   HCT 32.3* 12/17/2013  5:30 PM    Discharge Diagnoses: Term Pregnancy-delivered  Discharge Information: Date: [name] Activity: pelvic rest Diet: routine  Medications: Ibuprofen Breast feeding:  Yes Condition: stable Instructions: refer to practice specific booklet and refer to  handout Discharge to: home      Medication List    ASK your doctor about these medications       MULTIVITAMIN GUMMIES ADULTS Chew  Chew 1 tablet by mouth daily.         Perry Mount ,MD OB Fellow [name],[name]

## 2013-12-19 NOTE — Lactation Note (Signed)
This note was copied from the chart of [name]. Lactation Consultation Note  Patient Name: [name] [id number] Date: 12/19/2013 Reason for consult: Follow-up assessment Mom reports to [facility] that she has decided to bottle feed. LC reviewed with Mom how to dry milk if her milk comes in. Advised of OP services if needed.   Maternal Data    Feeding    LATCH Score/Interventions                      Lactation Tools Discussed/Used     Consult Status Consult Status: Complete Date: 12/19/13 Follow-up type: In-patient    Alfred Levins 12/19/2013, 11:31 AM

## 2014-01-27 ENCOUNTER — Encounter: Payer: Self-pay | Admitting: Obstetrics & Gynecology

## 2014-01-27 ENCOUNTER — Ambulatory Visit (INDEPENDENT_AMBULATORY_CARE_PROVIDER_SITE_OTHER): Payer: Medicaid Other | Admitting: Obstetrics & Gynecology

## 2014-01-27 VITALS — BP 137/78 | HR 73 | Temp 98.5°F | Ht 64.0 in | Wt 195.3 lb

## 2014-01-27 DIAGNOSIS — Z3042 Encounter for surveillance of injectable contraceptive: Secondary | ICD-10-CM | POA: Insufficient documentation

## 2014-01-27 DIAGNOSIS — Z23 Encounter for immunization: Secondary | ICD-10-CM

## 2014-01-27 LAB — POCT PREGNANCY, URINE: Preg Test, Ur: NEGATIVE

## 2014-01-27 MED ORDER — MEDROXYPROGESTERONE ACETATE 104 MG/0.65ML ~~LOC~~ SUSP
104.0000 mg | Freq: Once | SUBCUTANEOUS | Status: AC
  Administered 2014-01-27: 104 mg via SUBCUTANEOUS

## 2014-01-27 MED ORDER — MEDROXYPROGESTERONE ACETATE 150 MG/ML IM SUSP
150.0000 mg | INTRAMUSCULAR | Status: AC
  Administered 2014-10-15 – 2015-09-15 (×4): 150 mg via INTRAMUSCULAR

## 2014-01-27 NOTE — Patient Instructions (Signed)
Contraception Choices Contraception (birth control) is the use of any methods or devices to prevent pregnancy. Below are some methods to help avoid pregnancy. HORMONAL METHODS   Contraceptive implant. This is a thin, plastic tube containing progesterone hormone. It does not contain estrogen hormone. Your health care provider inserts the tube in the inner part of the upper arm. The tube can remain in place for up to 3 years. After 3 years, the implant must be removed. The implant prevents the ovaries from releasing an egg (ovulation), thickens the cervical mucus to prevent sperm from entering the uterus, and thins the lining of the inside of the uterus.  Progesterone-only injections. These injections are given every 3 months by your health care provider to prevent pregnancy. This synthetic progesterone hormone stops the ovaries from releasing eggs. It also thickens cervical mucus and changes the uterine lining. This makes it harder for sperm to survive in the uterus.  Birth control pills. These pills contain estrogen and progesterone hormone. They work by preventing the ovaries from releasing eggs (ovulation). They also cause the cervical mucus to thicken, preventing the sperm from entering the uterus. Birth control pills are prescribed by a health care provider.Birth control pills can also be used to treat heavy periods.  Minipill. This type of birth control pill contains only the progesterone hormone. They are taken every day of each month and must be prescribed by your health care provider.  Birth control patch. The patch contains hormones similar to those in birth control pills. It must be changed once a week and is prescribed by a health care provider.  Vaginal ring. The ring contains hormones similar to those in birth control pills. It is left in the vagina for 3 weeks, removed for 1 week, and then a new one is put back in place. The patient must be comfortable inserting and removing the ring  from the vagina.A health care provider's prescription is necessary.  Emergency contraception. Emergency contraceptives prevent pregnancy after unprotected sexual intercourse. This pill can be taken right after sex or up to 5 days after unprotected sex. It is most effective the sooner you take the pills after having sexual intercourse. Most emergency contraceptive pills are available without a prescription. Check with your pharmacist. Do not use emergency contraception as your only form of birth control. BARRIER METHODS   Female condom. This is a thin sheath (latex or rubber) that is worn over the penis during sexual intercourse. It can be used with spermicide to increase effectiveness.  Female condom. This is a soft, loose-fitting sheath that is put into the vagina before sexual intercourse.  Diaphragm. This is a soft, latex, dome-shaped barrier that must be fitted by a health care provider. It is inserted into the vagina, along with a spermicidal jelly. It is inserted before intercourse. The diaphragm should be left in the vagina for 6 to 8 hours after intercourse.  Cervical cap. This is a round, soft, latex or plastic cup that fits over the cervix and must be fitted by a health care provider. The cap can be left in place for up to 48 hours after intercourse.  Sponge. This is a soft, circular piece of polyurethane foam. The sponge has spermicide in it. It is inserted into the vagina after wetting it and before sexual intercourse.  Spermicides. These are chemicals that kill or block sperm from entering the cervix and uterus. They come in the form of creams, jellies, suppositories, foam, or tablets. They do not require a   prescription. They are inserted into the vagina with an applicator before having sexual intercourse. The process must be repeated every time you have sexual intercourse. INTRAUTERINE CONTRACEPTION  Intrauterine device (IUD). This is a T-shaped device that is put in a woman's uterus  during a menstrual period to prevent pregnancy. There are 2 types:  Copper IUD. This type of IUD is wrapped in copper wire and is placed inside the uterus. Copper makes the uterus and fallopian tubes produce a fluid that kills sperm. It can stay in place for 10 years.  Hormone IUD. This type of IUD contains the hormone progestin (synthetic progesterone). The hormone thickens the cervical mucus and prevents sperm from entering the uterus, and it also thins the uterine lining to prevent implantation of a fertilized egg. The hormone can weaken or kill the sperm that get into the uterus. It can stay in place for 3-5 years, depending on which type of IUD is used. PERMANENT METHODS OF CONTRACEPTION  Female tubal ligation. This is when the woman's fallopian tubes are surgically sealed, tied, or blocked to prevent the egg from traveling to the uterus.  Hysteroscopic sterilization. This involves placing a small coil or insert into each fallopian tube. Your doctor uses a technique called hysteroscopy to do the procedure. The device causes scar tissue to form. This results in permanent blockage of the fallopian tubes, so the sperm cannot fertilize the egg. It takes about 3 months after the procedure for the tubes to become blocked. You must use another form of birth control for these 3 months.  Female sterilization. This is when the female has the tubes that carry sperm tied off (vasectomy).This blocks sperm from entering the vagina during sexual intercourse. After the procedure, the man can still ejaculate fluid (semen). NATURAL PLANNING METHODS  Natural family planning. This is not having sexual intercourse or using a barrier method (condom, diaphragm, cervical cap) on days the woman could become pregnant.  Calendar method. This is keeping track of the length of each menstrual cycle and identifying when you are fertile.  Ovulation method. This is avoiding sexual intercourse during ovulation.  Symptothermal  method. This is avoiding sexual intercourse during ovulation, using a thermometer and ovulation symptoms.  Post-ovulation method. This is timing sexual intercourse after you have ovulated. Regardless of which type or method of contraception you choose, it is important that you use condoms to protect against the transmission of sexually transmitted infections (STIs). Talk with your health care provider about which form of contraception is most appropriate for you. Document Released: 03/28/2005 Document Revised: 04/02/2013 Document Reviewed: 09/20/2012 [employer] Patient Information 2015 [employer]. This information is not intended to replace advice given to you by your health care provider. Make sure you discuss any questions you have with your health care provider.  

## 2014-01-27 NOTE — Progress Notes (Signed)
Here for postpartum visit. Wants to start depoprovera. 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

## 2014-01-27 NOTE — Progress Notes (Signed)
Patient ID: [name], female   DOB: Aug 31, 1985, 28 y.o.   MRN: [id number] Subjective: wants DMPA     [name] is a 28 y.o. female who presents for a postpartum visit. She is 6 weeks postpartum following a spontaneous vaginal delivery. I have fully reviewed the prenatal and intrapartum course. The delivery was at 40 gestational weeks. Outcome: spontaneous vaginal delivery. Anesthesia: epidural. Postpartum course has been good. Baby's course has been normal. Baby is feeding by bottle -  . Bleeding staining only. Bowel function is normal. Bladder function is normal. Patient is not sexually active. Contraception method is Depo-Provera injections. Postpartum depression screening: negative.  The following portions of the patient's history were reviewed and updated as appropriate: allergies, current medications, past family history, past medical history, past social history, past surgical history and problem list.  Review of Systems Pertinent items are noted in HPI.   Objective:    BP 137/78  Pulse 73  Temp(Src) 98.5 F (36.9 C)  Ht 5\' 4"  (1.626 m)  Wt 195 lb 4.8 oz (88.587 kg)  BMI 33.51 kg/m2  Breastfeeding? No  General:  alert, cooperative and no distress   Breasts:     Lungs:    Heart:     Abdomen: soft, non-tender; bowel sounds normal; no masses,  no organomegaly   Vulva:  not evaluated  Vagina: not evaluated  Cervix:     Corpus: not examined  Adnexa:  not evaluated  Rectal Exam: Not performed.        Assessment:     normal  postpartum exam. Pap smear not done at today's visit.   Plan:    1. Contraception: Depo-Provera injections 2. RTC 3 months 3. Follow up in: 3 months or as needed.   Adam PhenixJames G Cattie Tineo, MD 01/27/2014

## 2014-02-10 ENCOUNTER — Encounter: Payer: Self-pay | Admitting: Obstetrics & Gynecology

## 2014-04-21 ENCOUNTER — Ambulatory Visit (INDEPENDENT_AMBULATORY_CARE_PROVIDER_SITE_OTHER): Payer: Medicaid Other | Admitting: *Deleted

## 2014-04-21 VITALS — BP 136/98 | HR 86

## 2014-04-21 DIAGNOSIS — Z3042 Encounter for surveillance of injectable contraceptive: Secondary | ICD-10-CM

## 2014-04-21 MED ORDER — MEDROXYPROGESTERONE ACETATE 104 MG/0.65ML ~~LOC~~ SUSP
104.0000 mg | Freq: Once | SUBCUTANEOUS | Status: AC
  Administered 2014-04-21: 104 mg via SUBCUTANEOUS

## 2014-07-14 ENCOUNTER — Ambulatory Visit: Payer: Medicaid Other

## 2014-07-24 ENCOUNTER — Ambulatory Visit (INDEPENDENT_AMBULATORY_CARE_PROVIDER_SITE_OTHER): Payer: Medicaid Other

## 2014-07-24 VITALS — BP 138/93 | HR 51 | Temp 98.5°F | Wt 195.1 lb

## 2014-07-24 DIAGNOSIS — Z3042 Encounter for surveillance of injectable contraceptive: Secondary | ICD-10-CM

## 2014-07-24 MED ORDER — MEDROXYPROGESTERONE ACETATE 104 MG/0.65ML ~~LOC~~ SUSP
104.0000 mg | Freq: Once | SUBCUTANEOUS | Status: AC
  Administered 2014-07-24: 104 mg via SUBCUTANEOUS

## 2014-07-24 NOTE — Progress Notes (Signed)
Patient here today for depo provera 104mg  injection within appropriate time frame. Depo 104mg  administered into right anterior thigh. Patient tolerated well. Reports occasional spotting. Informed patient that this is normal. No further questions, concerns or problems to report.

## 2014-07-28 ENCOUNTER — Ambulatory Visit: Payer: Medicaid Other

## 2014-10-15 ENCOUNTER — Ambulatory Visit (INDEPENDENT_AMBULATORY_CARE_PROVIDER_SITE_OTHER): Payer: Medicaid Other | Admitting: *Deleted

## 2014-10-15 VITALS — BP 126/79 | HR 60 | Wt 192.9 lb

## 2014-10-15 DIAGNOSIS — Z3042 Encounter for surveillance of injectable contraceptive: Secondary | ICD-10-CM | POA: Diagnosis not present

## 2014-10-15 NOTE — Progress Notes (Signed)
C/o bleeding off/on for 2 weeks each month , last month bled for 4 weeks. States some day light bleeding, some days heavy. We discussed has been on 104 mg depoprovera, will change to 150 mg and see if that helps her bleeding. Also discussed will be due her annual in October.

## 2014-12-31 ENCOUNTER — Ambulatory Visit: Payer: Medicaid Other

## 2015-01-15 ENCOUNTER — Ambulatory Visit: Payer: Self-pay

## 2015-01-15 ENCOUNTER — Ambulatory Visit: Payer: Medicaid Other

## 2015-01-19 ENCOUNTER — Ambulatory Visit (INDEPENDENT_AMBULATORY_CARE_PROVIDER_SITE_OTHER): Payer: Medicaid Other | Admitting: *Deleted

## 2015-01-19 VITALS — BP 139/89 | HR 64 | Temp 98.1°F | Wt 190.8 lb

## 2015-01-19 DIAGNOSIS — Z3042 Encounter for surveillance of injectable contraceptive: Secondary | ICD-10-CM

## 2015-04-08 ENCOUNTER — Ambulatory Visit (INDEPENDENT_AMBULATORY_CARE_PROVIDER_SITE_OTHER): Payer: Self-pay | Admitting: *Deleted

## 2015-04-08 VITALS — BP 135/88 | HR 76

## 2015-04-08 DIAGNOSIS — Z3042 Encounter for surveillance of injectable contraceptive: Secondary | ICD-10-CM

## 2015-04-08 MED ORDER — MEDROXYPROGESTERONE ACETATE 150 MG/ML IM SUSP
150.0000 mg | Freq: Once | INTRAMUSCULAR | Status: AC
  Administered 2015-04-08: 150 mg via INTRAMUSCULAR

## 2015-06-24 ENCOUNTER — Ambulatory Visit (INDEPENDENT_AMBULATORY_CARE_PROVIDER_SITE_OTHER): Payer: Medicaid Other | Admitting: General Practice

## 2015-06-24 ENCOUNTER — Ambulatory Visit: Payer: Medicaid Other

## 2015-06-24 VITALS — BP 130/89 | HR 67 | Temp 99.1°F | Ht 64.0 in | Wt 201.0 lb

## 2015-06-24 DIAGNOSIS — Z3042 Encounter for surveillance of injectable contraceptive: Secondary | ICD-10-CM

## 2015-09-09 ENCOUNTER — Ambulatory Visit: Payer: Self-pay

## 2015-09-15 ENCOUNTER — Ambulatory Visit (INDEPENDENT_AMBULATORY_CARE_PROVIDER_SITE_OTHER): Payer: Medicaid Other | Admitting: *Deleted

## 2015-09-15 VITALS — BP 129/88 | HR 85

## 2015-09-15 DIAGNOSIS — Z3042 Encounter for surveillance of injectable contraceptive: Secondary | ICD-10-CM

## 2015-09-15 NOTE — Progress Notes (Signed)
Here for depo-provera. We discussed it has been over 1.5 years since last doctor visit. Should have annual exam with next shot or at her convenience.
